# Patient Record
Sex: Female | Born: 1985 | Race: Black or African American | Hispanic: No | Marital: Single | Smoking: Never smoker
Health system: Southern US, Community
[De-identification: ages and names within clinical notes are randomized; demographics above are authoritative.]

---

## 2009-10-08 ENCOUNTER — Emergency Department (HOSPITAL_COMMUNITY): Admission: EM | Admit: 2009-10-08 | Discharge: 2009-10-08 | Payer: Self-pay | Admitting: Emergency Medicine

## 2009-10-30 ENCOUNTER — Emergency Department (HOSPITAL_COMMUNITY): Admission: EM | Admit: 2009-10-30 | Discharge: 2009-10-30 | Payer: Self-pay | Admitting: Emergency Medicine

## 2015-04-21 ENCOUNTER — Encounter (HOSPITAL_COMMUNITY): Payer: Self-pay | Admitting: Emergency Medicine

## 2015-04-21 ENCOUNTER — Emergency Department (HOSPITAL_COMMUNITY)
Admission: EM | Admit: 2015-04-21 | Discharge: 2015-04-21 | Disposition: A | Discharge status: Home / Self Care | Payer: Medicaid Other | Attending: Emergency Medicine | Admitting: Emergency Medicine

## 2015-04-21 ENCOUNTER — Emergency Department (HOSPITAL_COMMUNITY): Payer: Medicaid Other

## 2015-04-21 DIAGNOSIS — R1011 Right upper quadrant pain: Secondary | ICD-10-CM

## 2015-04-21 DIAGNOSIS — R110 Nausea: Secondary | ICD-10-CM

## 2015-04-21 DIAGNOSIS — K8020 Calculus of gallbladder without cholecystitis without obstruction: Secondary | ICD-10-CM | POA: Diagnosis not present

## 2015-04-21 LAB — COMPREHENSIVE METABOLIC PANEL
ALK PHOS: 226 U/L — AB (ref 38–126)
ALT: 317 U/L — AB (ref 14–54)
AST: 349 U/L — AB (ref 15–41)
Albumin: 3.9 g/dL (ref 3.5–5.0)
Anion gap: 9 (ref 5–15)
BUN: 8 mg/dL (ref 6–20)
CALCIUM: 9.6 mg/dL (ref 8.9–10.3)
CHLORIDE: 104 mmol/L (ref 101–111)
CO2: 27 mmol/L (ref 22–32)
CREATININE: 1.14 mg/dL — AB (ref 0.44–1.00)
GFR calc Af Amer: >60 mL/min (ref >60)
Glucose, Bld: 92 mg/dL (ref 65–99)
Potassium: 4.1 mmol/L (ref 3.5–5.1)
Sodium: 140 mmol/L (ref 135–145)
Total Bilirubin: 0.7 mg/dL (ref 0.3–1.2)
Total Protein: 7.6 g/dL (ref 6.5–8.1)

## 2015-04-21 LAB — CBC
HCT: 38.5 % (ref 36.0–46.0)
Hemoglobin: 11.9 g/dL — ABNORMAL LOW (ref 12.0–15.0)
MCH: 26.5 pg (ref 26.0–34.0)
MCHC: 30.9 g/dL (ref 30.0–36.0)
MCV: 85.7 fL (ref 78.0–100.0)
PLATELETS: 317 10*3/uL (ref 150–400)
RBC: 4.49 MIL/uL (ref 3.87–5.11)
RDW: 14.5 % (ref 11.5–15.5)
WBC: 5.8 10*3/uL (ref 4.0–10.5)

## 2015-04-21 LAB — URINALYSIS, ROUTINE W REFLEX MICROSCOPIC
Bilirubin Urine: NEGATIVE
GLUCOSE, UA: NEGATIVE mg/dL
KETONES UR: 15 mg/dL — AB
Leukocytes, UA: NEGATIVE
Nitrite: NEGATIVE
PROTEIN: NEGATIVE mg/dL
Specific Gravity, Urine: 1.023 (ref 1.005–1.030)
pH: 6 (ref 5.0–8.0)

## 2015-04-21 LAB — URINE MICROSCOPIC-ADD ON

## 2015-04-21 LAB — LIPASE, BLOOD: LIPASE: 38 U/L (ref 11–51)

## 2015-04-21 MED ORDER — FENTANYL CITRATE (PF) 100 MCG/2ML IJ SOLN
100.0000 ug | Freq: Once | INTRAMUSCULAR | Status: AC
  Administered 2015-04-21: 100 ug via INTRAVENOUS
  Filled 2015-04-21: qty 2

## 2015-04-21 MED ORDER — ONDANSETRON HCL 4 MG/2ML IJ SOLN
4.0000 mg | Freq: Once | INTRAMUSCULAR | Status: AC
  Administered 2015-04-21: 4 mg via INTRAVENOUS
  Filled 2015-04-21: qty 2

## 2015-04-21 MED ORDER — ONDANSETRON 4 MG PO TBDP: 4.0000 mg | ORAL_TABLET | Freq: Three times a day (TID) | ORAL | Status: AC | PRN

## 2015-04-21 MED ORDER — SODIUM CHLORIDE 0.9 % IV BOLUS (SEPSIS)
1000.0000 mL | Freq: Once | INTRAVENOUS | Status: AC
  Administered 2015-04-21: 1000 mL via INTRAVENOUS

## 2015-04-21 NOTE — Progress Notes (Signed)
Examined otherwise healthy patient who is still somewhat symptomatic from cholelithiasis.  Elevated LFTs probably from a passed CBD stone.  If she can become minimally symptomatic tonight, she can go home and I will try to get her on the schedule for DOW on Monday.  If not, will admit her tonight and plan for OR on Thursday/tomorrow.  Marta LamasJames O. Gae BonWyatt, III, MD, FACS [phone]--pager [phone]--office [facility]

## 2015-04-21 NOTE — Discharge Instructions (Signed)
Biliary Colic °Biliary colic is a pain in the upper abdomen. The pain: °· Is usually felt on the right side of the abdomen, but it may also be felt in the center of the abdomen, just below the breastbone (sternum). °· May spread back toward the right shoulder blade. °· May be steady or irregular. °· May be accompanied by nausea and vomiting. °Most of the time, the pain goes away in 1-5 hours. After the most intense pain passes, the abdomen may continue to ache mildly for about 24 hours. °Biliary colic is caused by a blockage in the bile duct. The bile duct is a pathway that carries bile--a liquid that helps to digest fats--from the gallbladder to the small intestine. Biliary colic usually occurs after eating, when the digestive system demands bile. The pain develops when muscle cells contract forcefully to try to move the blockage so that bile can get by. °HOME CARE INSTRUCTIONS °· Take medicines only as directed by your health care provider. °· Drink enough fluid to keep your urine clear or pale yellow. °· Avoid fatty, greasy, and fried foods. These kinds of foods increase your body's demand for bile. °· Avoid any foods that make your pain worse. °· Avoid overeating. °· Avoid having a large meal after fasting. °SEEK MEDICAL CARE IF: °· You develop a fever. °· Your pain gets worse. °· You vomit. °· You develop nausea that prevents you from eating and drinking. °SEEK IMMEDIATE MEDICAL CARE IF: °· You suddenly develop a fever and shaking chills. °· You develop a yellowish discoloration (jaundice) of: °¨ Skin. °¨ Whites of the eyes. °¨ Mucous membranes. °· You have continuous or severe pain that is not relieved with medicines. °· You have nausea and vomiting that is not relieved with medicines. °· You develop dizziness or you faint. °  °This information is not intended to replace advice given to you by your health care provider. Make sure you discuss any questions you have with your health care provider. °  °Document  Released: 10/16/2005 Document Revised: 09/29/2014 Document Reviewed: 02/24/2014 °Elsevier Interactive Patient Education ©2016 [employer] ° °

## 2015-04-21 NOTE — ED Notes (Signed)
Pt states since last Tuesday she having been having upper abdominal pain with nausea Denies any diarrhea or vomiting.  Pt states natural child birth 1 month ago.

## 2015-04-21 NOTE — ED Provider Notes (Signed)
CSN: [id number]     Arrival date & time 04/21/15  1803 History   First MD Initiated Contact with Patient 04/21/15 1831     Chief Complaint  Patient presents with  . Abdominal Pain    Patient is a 29 y.o. female presenting with abdominal pain. The history is provided by the patient.  Abdominal Pain Pain location:  Epigastric Pain quality: aching   Pain radiates to:  RUQ Pain severity:  Moderate Onset quality:  Gradual Duration:  1 week Timing:  Constant Chronicity:  New Associated symptoms: nausea   Associated symptoms: no chest pain, no constipation, no dysuria, no fever, no hematuria, no shortness of breath, no sore throat and no vomiting     History reviewed. No pertinent past medical history. History reviewed. No pertinent past surgical history. No family history on file. Social History  Substance Use Topics  . Smoking status: Never Smoker   . Smokeless tobacco: None  . Alcohol Use: No   OB History    No data available     Review of Systems  Constitutional: Negative for fever.  HENT: Negative for rhinorrhea and sore throat.   Eyes: Negative for visual disturbance.  Respiratory: Negative for chest tightness and shortness of breath.   Cardiovascular: Negative for chest pain and palpitations.  Gastrointestinal: Positive for nausea and abdominal pain. Negative for vomiting and constipation.  Genitourinary: Negative for dysuria and hematuria.  Musculoskeletal: Negative for back pain and neck pain.  Skin: Negative for rash.  Neurological: Negative for dizziness and headaches.  Psychiatric/Behavioral: Negative for confusion.  All other systems reviewed and are negative.     Allergies  Review of patient's allergies indicates no known allergies.  Home Medications   Prior to Admission medications   Medication Sig Start Date End Date Taking? Authorizing Provider  ondansetron (ZOFRAN ODT) 4 MG disintegrating tablet Take 1 tablet (4 mg total) by mouth every 8 (eight)  hours as needed for nausea or vomiting. 04/21/15   Gustavus Bryant, MD   BP 130/93 mmHg  Pulse 75  Temp(Src) 97.9 F (36.6 C) (Oral)  Resp 16  Ht _0  (1.854 m)  Wt 86.183 kg  BMI 25.07 kg/m2  SpO2 99% Physical Exam  Constitutional: She is oriented to person, place, and time. She appears well-developed and well-nourished. No distress.  HENT:  Head: Normocephalic and atraumatic.  Mouth/Throat: Oropharynx is clear and moist.  Eyes: EOM are normal. Pupils are equal, round, and reactive to light.  Neck: Neck supple. No JVD present.  Cardiovascular: Normal rate, regular rhythm, normal heart sounds and intact distal pulses.  Exam reveals no gallop.   No murmur heard. Pulmonary/Chest: Effort normal and breath sounds normal. She has no wheezes. She has no rales.  Abdominal: Soft. She exhibits no distension. There is tenderness in the right upper quadrant. There is positive Murphy's sign. There is no rigidity, no rebound and no guarding.  Musculoskeletal: Normal range of motion. She exhibits no tenderness.  Neurological: She is alert and oriented to person, place, and time. No cranial nerve deficit. She exhibits normal muscle tone.  Skin: Skin is warm and dry. No rash noted.  Psychiatric: Her behavior is normal.    ED Course  Procedures  None   Labs Review Labs Reviewed  COMPREHENSIVE METABOLIC PANEL - Abnormal; Notable for the following:    Creatinine, Ser 1.14 (*)    AST 349 (*)    ALT 317 (*)    Alkaline Phosphatase 226 (*)  All other components within normal limits  CBC - Abnormal; Notable for the following:    Hemoglobin 11.9 (*)    All other components within normal limits  URINALYSIS, ROUTINE W REFLEX MICROSCOPIC (NOT AT [facility]) - Abnormal; Notable for the following:    Color, Urine AMBER (*)    APPearance CLOUDY (*)    Hgb urine dipstick LARGE (*)    Ketones, ur 15 (*)    All other components within normal limits  URINE MICROSCOPIC-ADD ON - Abnormal; Notable for the  following:    Squamous Epithelial / LPF 0-5 (*)    Bacteria, UA FEW (*)    All other components within normal limits  LIPASE, BLOOD    Imaging Review US Abdomen Limited Ruq  04/21/2015  CLINICAL DATA:  Abdominal pain with nausea for 1 week EXAM: US ABDOMEN LIMITED - RIGHT UPPER QUADRANT COMPARISON:  None. FINDINGS: Gallbladder: Within the gallbladder, there are multiple echogenic foci which move and shadow consistent with gallstones. Largest individual gallstone measures 5 mm in length. No gallbladder wall thickening or pericholecystic fluid. No sonographic Murphy sign noted. Common bile duct: Diameter: Common bile duct is prominent proximally, measuring 8 mm in diameter. More distally, the common bile duct is obscured by gas. Liver: No focal lesion identified. Within normal limits in parenchymal echogenicity. IMPRESSION: Cholelithiasis. Prominence of the proximal common bile duct with distal common bile duct obscured by gas. A distal partially obstructing common bile duct lesion cannot be excluded by this study. If further evaluation is felt to be warranted, MRCP could be helpful to further assess. Electronically Signed   By: Lowella Grip III M.D.   On: 04/21/2015 19:35   I have personally reviewed and evaluated these images and lab results as part of my medical decision-making.  MDM   Final diagnoses:  Nausea  RUQ pain  Symptomatic cholelithiasis    1 week epigastric pain radiating to RUQ, ? Worse with eating. No bowel, vaginal or urinary symptoms. TTP RUQ with + Murphy sign. Also with diffuse abd TTP. Patient is 1 month postpartum. Will check labs and RUQ [place].   LFTs and alk phos elevated. Normal WBC. Suspicious for hepatobiliary disease. [place] with gallstone. CBD 8 mm. No cholecystitis. General surgery consulted.  Pain resolved with pain meds. Will plan for elective surgical management. zofran prescribed. Stable for d/c. Able to tolerate PO.  Discussed with Dr. Audie Pinto.  Gustavus Bryant, MD 04/21/15 2154  Leonard Schwartz, MD 05/03/15 [phone]

## 2015-04-22 ENCOUNTER — Encounter (HOSPITAL_COMMUNITY): Payer: Self-pay | Admitting: *Deleted

## 2015-04-22 ENCOUNTER — Ambulatory Visit: Payer: Self-pay | Admitting: General Surgery

## 2015-04-22 ENCOUNTER — Inpatient Hospital Stay (HOSPITAL_COMMUNITY)
Admission: EM | Admit: 2015-04-22 | Discharge: 2015-04-25 | DRG: 769 | Disposition: A | Discharge status: Home / Self Care | Payer: Medicaid Other | Attending: General Surgery | Admitting: General Surgery

## 2015-04-22 DIAGNOSIS — K851 Biliary acute pancreatitis without necrosis or infection: Secondary | ICD-10-CM

## 2015-04-22 DIAGNOSIS — Z91030 Bee allergy status: Secondary | ICD-10-CM

## 2015-04-22 DIAGNOSIS — Z419 Encounter for procedure for purposes other than remedying health state, unspecified: Secondary | ICD-10-CM

## 2015-04-22 DIAGNOSIS — O2663 Liver and biliary tract disorders in the puerperium: Principal | ICD-10-CM | POA: Diagnosis present

## 2015-04-22 LAB — CBC WITH DIFFERENTIAL/PLATELET
Basophils Absolute: 0 10*3/uL (ref 0.0–0.1)
Basophils Relative: 0 %
EOS PCT: 6 %
Eosinophils Absolute: 0.4 10*3/uL (ref 0.0–0.7)
HCT: 35.9 % — ABNORMAL LOW (ref 36.0–46.0)
Hemoglobin: 11.3 g/dL — ABNORMAL LOW (ref 12.0–15.0)
LYMPHS ABS: 2.5 10*3/uL (ref 0.7–4.0)
LYMPHS PCT: 31 %
MCH: 27.2 pg (ref 26.0–34.0)
MCHC: 31.5 g/dL (ref 30.0–36.0)
MCV: 86.3 fL (ref 78.0–100.0)
MONO ABS: 0.5 10*3/uL (ref 0.1–1.0)
MONOS PCT: 6 %
Neutro Abs: 4.6 10*3/uL (ref 1.7–7.7)
Neutrophils Relative %: 57 %
PLATELETS: 302 10*3/uL (ref 150–400)
RBC: 4.16 MIL/uL (ref 3.87–5.11)
RDW: 14.6 % (ref 11.5–15.5)
WBC: 8.1 10*3/uL (ref 4.0–10.5)

## 2015-04-22 MED ORDER — ONDANSETRON HCL 4 MG/2ML IJ SOLN
4.0000 mg | Freq: Once | INTRAMUSCULAR | Status: AC
  Administered 2015-04-22: 4 mg via INTRAVENOUS
  Filled 2015-04-22: qty 2

## 2015-04-22 MED ORDER — HYDROMORPHONE HCL 1 MG/ML IJ SOLN
1.0000 mg | Freq: Once | INTRAMUSCULAR | Status: AC
  Administered 2015-04-22: 1 mg via INTRAVENOUS
  Filled 2015-04-22: qty 1

## 2015-04-22 NOTE — ED Notes (Signed)
Pt states she was here last night and told that she has gallstones. Was informed to come back to ED if pain got worse. Pt states that the pain has gotten worse.

## 2015-04-22 NOTE — ED Provider Notes (Signed)
CSN: [id number]     Arrival date & time 04/22/15  2205 History  By signing my name below, I, Doreatha MartinEva Mathews, attest that this documentation has been prepared under the direction and in the presence of Derwood KaplanAnkit Katja Blue, MD. Electronically Signed: Doreatha MartinEva Mathews, ED Scribe. 04/22/2015. 11:35 PM.    Chief Complaint  Patient presents with  . Cholelithiasis   The history is provided by the patient. No language interpreter was used.    HPI Comments: Kylie Frank is a 29 y.o. female with h/o cholelithiasis who presents to the Emergency Department complaining of moderate, intermittent epigastric and RUQ abdominal pain onset at 2AM this morning and again this evening. She states associated nausea and emesis (x2 today). No treatments tried PTA. She states that pain was worsened after eating this evening. Pt was seen yesterday for the same pain that was onset a week ago. She was diagnosed with cholelithiasis and given return precautions if her pain worsened. She reports that pain medication given  in the ED gave temporarily relief of pain.    History reviewed. No pertinent past medical history. History reviewed. No pertinent past surgical history. History reviewed. No pertinent family history. Social History  Substance Use Topics  . Smoking status: Never Smoker   . Smokeless tobacco: None  . Alcohol Use: No   OB History    No data available     Review of Systems  Gastrointestinal: Positive for nausea, vomiting and abdominal pain.   A complete 10 system review of systems was obtained and all systems are negative except as noted in the HPI and PMH.    Allergies  Bee venom  Home Medications   Prior to Admission medications   Medication Sig Start Date End Date Taking? Authorizing Provider  ondansetron (ZOFRAN ODT) 4 MG disintegrating tablet Take 1 tablet (4 mg total) by mouth every 8 (eight) hours as needed for nausea or vomiting. 04/21/15  Yes Maris BergerJonah Gunalda, MD  oxyCODONE-acetaminophen (ROXICET)  5-325 MG tablet Take 1-2 tablets by mouth every 4 (four) hours as needed. 04/25/15   Chevis PrettyPaul Toth III, MD  traMADol (ULTRAM) 50 MG tablet Take 1-2 tablets (50-100 mg total) by mouth every 6 (six) hours as needed. 04/25/15   Chevis PrettyPaul Toth III, MD   BP 133/88 mmHg  Pulse 88  Temp(Src) 98.3 F (36.8 C) (Oral)  Resp 16  Ht 6\' 1"  (1.854 m)  Wt 234 lb 9.6 oz (106.414 kg)  BMI 30.96 kg/m2  SpO2 99% Physical Exam  Constitutional: She is oriented to person, place, and time. She appears well-developed and well-nourished.  HENT:  Head: Normocephalic and atraumatic.  Eyes: Conjunctivae and EOM are normal. Pupils are equal, round, and reactive to light.  Neck: Normal range of motion. Neck supple.  Cardiovascular: Normal rate, regular rhythm and normal heart sounds.   Pulmonary/Chest: Effort normal and breath sounds normal. No respiratory distress.  Lungs CTA bilaterally.   Abdominal: Soft. She exhibits no distension. There is tenderness. There is guarding.  Positive RUQ tenderness with guarding.   Musculoskeletal: Normal range of motion.  Neurological: She is alert and oriented to person, place, and time.  Skin: Skin is warm and dry.  Psychiatric: She has a normal mood and affect. Her behavior is normal.  Nursing note and vitals reviewed.  ED Course  Procedures (including critical care time) DIAGNOSTIC STUDIES: Oxygen Saturation is 98% on RA, normal by my interpretation.    COORDINATION OF CARE: 11:26 PM Discussed treatment plan with pt at bedside and  pt agreed to plan.  12:58 AM On reassessment; labs indicate gallstone pancreatitis. General surgery has been called for admisson. Dr. Sheliah Hatch will admit pt.    Labs Review Labs Reviewed  CBC WITH DIFFERENTIAL/PLATELET - Abnormal; Notable for the following:    Hemoglobin 11.3 (*)    HCT 35.9 (*)    All other components within normal limits  COMPREHENSIVE METABOLIC PANEL - Abnormal; Notable for the following:    BUN 5 (*)    Creatinine, Ser  1.07 (*)    AST 284 (*)    ALT 351 (*)    Alkaline Phosphatase 225 (*)    All other components within normal limits  LIPASE, BLOOD - Abnormal; Notable for the following:    Lipase >3000 (*)    All other components within normal limits  CREATININE, SERUM - Abnormal; Notable for the following:    Creatinine, Ser 1.22 (*)    GFR calc non Af Amer 59 (*)    All other components within normal limits  LIPASE, BLOOD - Abnormal; Notable for the following:    Lipase 146 (*)    All other components within normal limits  COMPREHENSIVE METABOLIC PANEL - Abnormal; Notable for the following:    Potassium 3.3 (*)    BUN <5 (*)    AST 91 (*)    ALT 205 (*)    Alkaline Phosphatase 182 (*)    All other components within normal limits  CBC - Abnormal; Notable for the following:    Hemoglobin 10.9 (*)    HCT 34.8 (*)    All other components within normal limits  COMPREHENSIVE METABOLIC PANEL - Abnormal; Notable for the following:    Glucose, Bld 125 (*)    BUN 5 (*)    Albumin 3.4 (*)    AST 69 (*)    ALT 155 (*)    Alkaline Phosphatase 173 (*)    Total Bilirubin 0.1 (*)    All other components within normal limits  SURGICAL PCR SCREEN  DIFFERENTIAL  SURGICAL PATHOLOGY    Imaging Review Dg Cholangiogram Operative  04/24/2015  CLINICAL DATA:  Intraoperative cholangiogram EXAM: INTRAOPERATIVE CHOLANGIOGRAM TECHNIQUE: Cholangiographic images from the C-arm fluoroscopic device were submitted for interpretation post-operatively. Please see the procedural report for the amount of contrast and the fluoroscopy time utilized. COMPARISON:  None. FINDINGS: Forty-five intraoperative cholangiogram images submitted. There is contrast opacification of CBD, cystic duct intrahepatic ducts and contrast is noted progressing in duodenum. Mild prominent CBD. No CBD filling defects. No contrast extravasation. The patient is status postcholecystectomy. IMPRESSION: There is contrast opacification of CBD, cystic duct  intrahepatic ducts and contrast is noted progressing in duodenum. Mild prominent CBD. No CBD filling defects. No contrast extravasation. Fluoroscopy time 8.8 seconds.  Please see the operative report. Electronically Signed   By: Natasha Mead M.D.   On: 04/24/2015 11:39   I have personally reviewed and evaluated these images and lab results as part of my medical decision-making.  MDM   Final diagnoses:  Acute gallstone pancreatitis    I personally performed the services described in this documentation, which was scribed in my presence. The recorded information has been reviewed and is accurate.  Pt comes in with cc of RUQ abd pain. Pt was seen yday in the ER with the same complains and had an [place] that showed gall stones with possible CBD dilatation. She was discharged with relative pain control, however her pain returned and was severe so she came back to the  ER. Currently has RUQ guarding, labs are pending. Will need admission.   Derwood Kaplan, MD 04/25/15 [phone]

## 2015-04-23 DIAGNOSIS — K851 Biliary acute pancreatitis without necrosis or infection: Secondary | ICD-10-CM | POA: Diagnosis present

## 2015-04-23 DIAGNOSIS — O2663 Liver and biliary tract disorders in the puerperium: Secondary | ICD-10-CM | POA: Diagnosis present

## 2015-04-23 DIAGNOSIS — Z91030 Bee allergy status: Secondary | ICD-10-CM | POA: Diagnosis not present

## 2015-04-23 LAB — COMPREHENSIVE METABOLIC PANEL
ALT: 351 U/L — AB (ref 14–54)
AST: 284 U/L — ABNORMAL HIGH (ref 15–41)
Albumin: 3.9 g/dL (ref 3.5–5.0)
Alkaline Phosphatase: 225 U/L — ABNORMAL HIGH (ref 38–126)
Anion gap: 8 (ref 5–15)
BUN: 5 mg/dL — AB (ref 6–20)
CO2: 26 mmol/L (ref 22–32)
Calcium: 9.2 mg/dL (ref 8.9–10.3)
Chloride: 104 mmol/L (ref 101–111)
Creatinine, Ser: 1.07 mg/dL — ABNORMAL HIGH (ref 0.44–1.00)
GLUCOSE: 93 mg/dL (ref 65–99)
Potassium: 3.5 mmol/L (ref 3.5–5.1)
Sodium: 138 mmol/L (ref 135–145)
TOTAL PROTEIN: 7.6 g/dL (ref 6.5–8.1)
Total Bilirubin: 1.1 mg/dL (ref 0.3–1.2)

## 2015-04-23 LAB — CREATININE, SERUM
Creatinine, Ser: 1.22 mg/dL — ABNORMAL HIGH (ref 0.44–1.00)
GFR calc Af Amer: >60 mL/min (ref >60)
GFR, EST NON AFRICAN AMERICAN: 59 mL/min — AB (ref >60)

## 2015-04-23 LAB — LIPASE, BLOOD: Lipase: >3000 U/L — ABNORMAL HIGH (ref 11–51)

## 2015-04-23 LAB — SURGICAL PCR SCREEN
MRSA, PCR: NEGATIVE
Staphylococcus aureus: NEGATIVE

## 2015-04-23 MED ORDER — ACETAMINOPHEN 325 MG PO TABS: 650.0000 mg | ORAL_TABLET | Freq: Four times a day (QID) | ORAL | Status: DC | PRN

## 2015-04-23 MED ORDER — CEFOXITIN SODIUM 2 G IV SOLR
2.0000 g | Freq: Three times a day (TID) | INTRAVENOUS | Status: DC
  Administered 2015-04-23 – 2015-04-25 (×8): 2 g via INTRAVENOUS
  Filled 2015-04-23 (×10): qty 2

## 2015-04-23 MED ORDER — DIPHENHYDRAMINE HCL 25 MG PO CAPS: 25.0000 mg | ORAL_CAPSULE | Freq: Four times a day (QID) | ORAL | Status: DC | PRN

## 2015-04-23 MED ORDER — ENOXAPARIN SODIUM 40 MG/0.4ML ~~LOC~~ SOLN
40.0000 mg | SUBCUTANEOUS | Status: DC
  Filled 2015-04-23 (×2): qty 0.4

## 2015-04-23 MED ORDER — DIPHENHYDRAMINE HCL 50 MG/ML IJ SOLN: 25.0000 mg | Freq: Four times a day (QID) | INTRAMUSCULAR | Status: DC | PRN

## 2015-04-23 MED ORDER — ACETAMINOPHEN 650 MG RE SUPP: 650.0000 mg | Freq: Four times a day (QID) | RECTAL | Status: DC | PRN

## 2015-04-23 MED ORDER — KCL IN DEXTROSE-NACL 20-5-0.45 MEQ/L-%-% IV SOLN
INTRAVENOUS | Status: DC
  Administered 2015-04-23 – 2015-04-24 (×4): via INTRAVENOUS
  Filled 2015-04-23 (×4): qty 1000

## 2015-04-23 MED ORDER — ONDANSETRON 4 MG PO TBDP: 4.0000 mg | ORAL_TABLET | Freq: Four times a day (QID) | ORAL | Status: DC | PRN

## 2015-04-23 MED ORDER — ONDANSETRON HCL 4 MG/2ML IJ SOLN: 4.0000 mg | Freq: Four times a day (QID) | INTRAMUSCULAR | Status: DC | PRN

## 2015-04-23 MED ORDER — DEXTROSE 5 % IV SOLN
2.0000 g | Freq: Four times a day (QID) | INTRAVENOUS | Status: DC
  Administered 2015-04-23: 2 g via INTRAVENOUS
  Filled 2015-04-23 (×2): qty 2

## 2015-04-23 MED ORDER — MORPHINE SULFATE (PF) 2 MG/ML IV SOLN
2.0000 mg | INTRAVENOUS | Status: DC | PRN
  Administered 2015-04-23 – 2015-04-24 (×3): 2 mg via INTRAVENOUS
  Administered 2015-04-24 – 2015-04-25 (×4): 4 mg via INTRAVENOUS
  Filled 2015-04-23 (×2): qty 1
  Filled 2015-04-23: qty 2
  Filled 2015-04-23: qty 1
  Filled 2015-04-23 (×3): qty 2

## 2015-04-23 NOTE — H&P (Signed)
Kylie Frank is an 29 y.o. female.   Chief Complaint: RUQ pain HPI: 29 yo female with 1 week of RUQ pain. She has had no previous attacks with similar pains. She has had nausea and vomiting for the last 2 days. Pain is cramping and sharp in nature, has been constant for last 2 days. She initially went to the ER and was sent home with presumed symp gallstones, but returned today for worsening pain. No fevers or chills, Last BM today. She recently had a baby 1 month ago with vaginal delivery.  History reviewed. No pertinent past medical history.  History reviewed. No pertinent past surgical history.  No family history on file. Social History:  reports that she has never smoked. She does not have any smokeless tobacco history on file. She reports that she does not drink alcohol or use illicit drugs.  Allergies:  Allergies  Allergen Reactions  . Bee Venom Swelling and Other (See Comments)    "I turn purple"     (Not in a hospital admission)  Results for orders placed or performed during the hospital encounter of 04/22/15 (from the past 48 hour(s))  CBC with Differential     Status: Abnormal   Collection Time: 04/22/15 11:15 PM  Result Value Ref Range   WBC 8.1 4.0 - 10.5 K/uL   RBC 4.16 3.87 - 5.11 MIL/uL   Hemoglobin 11.3 (L) 12.0 - 15.0 g/dL   HCT 35.9 (L) 36.0 - 46.0 %   MCV 86.3 78.0 - 100.0 fL   MCH 27.2 26.0 - 34.0 pg   MCHC 31.5 30.0 - 36.0 g/dL   RDW 14.6 11.5 - 15.5 %   Platelets 302 150 - 400 K/uL   Neutrophils Relative % 57 %   Neutro Abs 4.6 1.7 - 7.7 K/uL   Lymphocytes Relative 31 %   Lymphs Abs 2.5 0.7 - 4.0 K/uL   Monocytes Relative 6 %   Monocytes Absolute 0.5 0.1 - 1.0 K/uL   Eosinophils Relative 6 %   Eosinophils Absolute 0.4 0.0 - 0.7 K/uL   Basophils Relative 0 %   Basophils Absolute 0.0 0.0 - 0.1 K/uL  Comprehensive metabolic panel     Status: Abnormal   Collection Time: 04/22/15 11:15 PM  Result Value Ref Range   Sodium 138 135 - 145 mmol/L    Potassium 3.5 3.5 - 5.1 mmol/L   Chloride 104 101 - 111 mmol/L   CO2 26 22 - 32 mmol/L   Glucose, Bld 93 65 - 99 mg/dL   BUN 5 (L) 6 - 20 mg/dL   Creatinine, Ser 1.07 (H) 0.44 - 1.00 mg/dL   Calcium 9.2 8.9 - 10.3 mg/dL   Total Protein 7.6 6.5 - 8.1 g/dL   Albumin 3.9 3.5 - 5.0 g/dL   AST 284 (H) 15 - 41 U/L   ALT 351 (H) 14 - 54 U/L   Alkaline Phosphatase 225 (H) 38 - 126 U/L   Total Bilirubin 1.1 0.3 - 1.2 mg/dL   GFR calc non Af Amer >60 >60 mL/min   GFR calc Af Amer >60 >60 mL/min    Comment: (NOTE) The eGFR has been calculated using the CKD EPI equation. This calculation has not been validated in all clinical situations. eGFR's persistently <60 mL/min signify possible Chronic Kidney Disease.    Anion gap 8 5 - 15  Lipase, blood     Status: Abnormal   Collection Time: 04/22/15 11:15 PM  Result Value Ref Range   Lipase >  3000 (H) 11 - 51 U/L    Comment: RESULTS CONFIRMED BY MANUAL DILUTION   US Abdomen Limited Ruq  04/21/2015  CLINICAL DATA:  Abdominal pain with nausea for 1 week EXAM: US ABDOMEN LIMITED - RIGHT UPPER QUADRANT COMPARISON:  None. FINDINGS: Gallbladder: Within the gallbladder, there are multiple echogenic foci which move and shadow consistent with gallstones. Largest individual gallstone measures 5 mm in length. No gallbladder wall thickening or pericholecystic fluid. No sonographic Murphy sign noted. Common bile duct: Diameter: Common bile duct is prominent proximally, measuring 8 mm in diameter. More distally, the common bile duct is obscured by gas. Liver: No focal lesion identified. Within normal limits in parenchymal echogenicity. IMPRESSION: Cholelithiasis. Prominence of the proximal common bile duct with distal common bile duct obscured by gas. A distal partially obstructing common bile duct lesion cannot be excluded by this study. If further evaluation is felt to be warranted, MRCP could be helpful to further assess. Electronically Signed   By: Lowella Grip  III M.D.   On: 04/21/2015 19:35    Review of Systems  Constitutional: Negative for fever and chills.  HENT: Negative for hearing loss.   Eyes: Negative for blurred vision and double vision.  Respiratory: Negative for cough and hemoptysis.   Cardiovascular: Negative for chest pain and palpitations.  Gastrointestinal: Positive for nausea, vomiting and abdominal pain. Negative for diarrhea and constipation.  Genitourinary: Negative for dysuria and urgency.  Musculoskeletal: Negative for myalgias and neck pain.  Skin: Negative for itching and rash.  Neurological: Negative for dizziness, tingling and headaches.  Endo/Heme/Allergies: Does not bruise/bleed easily.  Psychiatric/Behavioral: Negative for depression and suicidal ideas.    Blood pressure 134/85, pulse 60, temperature 97.6 F (36.4 C), temperature source Oral, resp. rate 18, height _0  (1.854 m), weight 105.779 kg (233 lb 3.2 oz), SpO2 96 %. Physical Exam  Vitals reviewed. Constitutional: She is oriented to person, place, and time. She appears well-developed and well-nourished.  HENT:  Head: Normocephalic and atraumatic.  Eyes: Conjunctivae and EOM are normal. Pupils are equal, round, and reactive to light.  Neck: Normal range of motion. Neck supple.  Cardiovascular: Normal rate and regular rhythm.   Respiratory: Effort normal and breath sounds normal.  GI: Soft. Bowel sounds are normal. She exhibits no distension.  RUQ tenderness  Musculoskeletal: Normal range of motion.  Neurological: She is alert and oriented to person, place, and time.  Skin: Skin is warm and dry.  Psychiatric: She has a normal mood and affect. Her behavior is normal.     Assessment/Plan 29 yo female with gallstone pancreatitis. -NPO -admit -pain control -recheck labs in 24h -will need chole with IOC this hospitalization  Arta Bruce Kinsinger 04/23/2015, 1:34 AM

## 2015-04-23 NOTE — Progress Notes (Signed)
  Subjective: No complaints. Feels better  Objective: Vital signs in last 24 hours: Temp:  [97.5 F (36.4 C)-98.5 F (36.9 C)] 98.5 F (36.9 C) (11/25 0530) Pulse Rate:  [50-78] 63 (11/25 0700) Resp:  [18] 18 (11/25 0530) BP: (116-150)/(61-97) 116/61 mmHg (11/25 0530) SpO2:  [95 %-100 %] 96 % (11/25 0530) Weight:  [105.779 kg (233 lb 3.2 oz)-107.548 kg (237 lb 1.6 oz)] 107.548 kg (237 lb 1.6 oz) (11/25 0247) Last BM Date: 04/22/15  Intake/Output from previous day: 11/24 0701 - 11/25 0700 In: 667.9 [P.O.:120; I.V.:497.9; IV Piggyback:50] Out: -  Intake/Output this shift: Total I/O In: 202.1 [I.V.:152.1; IV Piggyback:50] Out: 550 [Urine:550]  Resp: clear to auscultation bilaterally Cardio: regular rate and rhythm GI: soft, nontender  Lab Results:   Recent Labs  04/21/15 1814 04/22/15 2315  WBC 5.8 8.1  HGB 11.9* 11.3*  HCT 38.5 35.9*  PLT 317 302   BMET  Recent Labs  04/21/15 1814 04/22/15 2315 04/23/15 0203  NA 140 138  --   K 4.1 3.5  --   CL 104 104  --   CO2 27 26  --   GLUCOSE 92 93  --   BUN 8 5*  --   CREATININE 1.14* 1.07* 1.22*  CALCIUM 9.6 9.2  --    PT/INR No results for input(s): LABPROT, INR in the last 72 hours. ABG No results for input(s): PHART, HCO3 in the last 72 hours.  Invalid input(s): PCO2, PO2  Studies/Results: [place] Abdomen Limited Ruq  04/21/2015  CLINICAL DATA:  Abdominal pain with nausea for 1 week EXAM: US ABDOMEN LIMITED - RIGHT UPPER QUADRANT COMPARISON:  None. FINDINGS: Gallbladder: Within the gallbladder, there are multiple echogenic foci which move and shadow consistent with gallstones. Largest individual gallstone measures 5 mm in length. No gallbladder wall thickening or pericholecystic fluid. No sonographic Murphy sign noted. Common bile duct: Diameter: Common bile duct is prominent proximally, measuring 8 mm in diameter. More distally, the common bile duct is obscured by gas. Liver: No focal lesion identified. Within  normal limits in parenchymal echogenicity. IMPRESSION: Cholelithiasis. Prominence of the proximal common bile duct with distal common bile duct obscured by gas. A distal partially obstructing common bile duct lesion cannot be excluded by this study. If further evaluation is felt to be warranted, MRCP could be helpful to further assess. Electronically Signed   By: Bretta BangWilliam  Woodruff III M.D.   On: 04/21/2015 19:35    Anti-infectives: Anti-infectives    Start     Dose/Rate Route Frequency Ordered Stop   04/23/15 0600  cefOXitin (MEFOXIN) 2 g in dextrose 5 % 50 mL IVPB     2 g 100 mL/hr over 30 Minutes Intravenous 3 times per day 04/23/15 0140     04/23/15 0100  cefOXitin (MEFOXIN) 2 g in dextrose 5 % 50 mL IVPB  Status:  Discontinued     2 g 100 mL/hr over 30 Minutes Intravenous 4 times per day 04/23/15 0059 04/23/15 0140      Assessment/Plan: s/p * No surgery found * continue bowel rest  Recheck lft's and amylase tomorrow  LOS: 0 days    TOTH III,Shadae Reino S 04/23/2015

## 2015-04-23 NOTE — Care Management Note (Signed)
Case Management Note  Patient Details  Name: Kylie Frank MRN: [id number] Date of Birth: 03-Feb-1986  Subjective/Objective:                    Action/Plan:   Expected Discharge Date:                  Expected Discharge Plan:  Home/Self Care  In-House Referral:     Discharge planning Services     Post Acute Care Choice:    Choice offered to:     DME Arranged:    DME Agency:     HH Arranged:    HH Agency:     Status of Service:  In process, will continue to follow  Medicare Important Message Given:    Date Medicare IM Given:    Medicare IM give by:    Date Additional Medicare IM Given:    Additional Medicare Important Message give by:     If discussed at Long Length of Stay Meetings, dates discussed:    Additional Comments: Initial UR completed  Kingsley PlanWile, Marshella Tello Marie, RN 04/23/2015, 8:30 AM

## 2015-04-24 ENCOUNTER — Inpatient Hospital Stay (HOSPITAL_COMMUNITY): Payer: Medicaid Other | Admitting: Anesthesiology

## 2015-04-24 ENCOUNTER — Encounter (HOSPITAL_COMMUNITY): Admission: EM | Disposition: A | Discharge status: Home / Self Care | Payer: Self-pay | Source: Home / Self Care

## 2015-04-24 ENCOUNTER — Encounter (HOSPITAL_COMMUNITY): Payer: Self-pay | Admitting: Anesthesiology

## 2015-04-24 ENCOUNTER — Inpatient Hospital Stay (HOSPITAL_COMMUNITY): Payer: Medicaid Other

## 2015-04-24 HISTORY — PX: CHOLECYSTECTOMY: SHX55

## 2015-04-24 LAB — DIFFERENTIAL
Basophils Absolute: 0 10*3/uL (ref 0.0–0.1)
Basophils Relative: 0 %
Eosinophils Absolute: 0.4 10*3/uL (ref 0.0–0.7)
Eosinophils Relative: 9 %
LYMPHS PCT: 45 %
Lymphs Abs: 2.2 10*3/uL (ref 0.7–4.0)
MONO ABS: 0.4 10*3/uL (ref 0.1–1.0)
MONOS PCT: 7 %
NEUTROS ABS: 1.9 10*3/uL (ref 1.7–7.7)
Neutrophils Relative %: 39 %

## 2015-04-24 LAB — COMPREHENSIVE METABOLIC PANEL
ALBUMIN: 3.5 g/dL (ref 3.5–5.0)
ALT: 205 U/L — ABNORMAL HIGH (ref 14–54)
ANION GAP: 6 (ref 5–15)
AST: 91 U/L — AB (ref 15–41)
Alkaline Phosphatase: 182 U/L — ABNORMAL HIGH (ref 38–126)
BILIRUBIN TOTAL: 0.7 mg/dL (ref 0.3–1.2)
CHLORIDE: 104 mmol/L (ref 101–111)
CO2: 27 mmol/L (ref 22–32)
Calcium: 9.1 mg/dL (ref 8.9–10.3)
Creatinine, Ser: 0.92 mg/dL (ref 0.44–1.00)
GFR calc Af Amer: >60 mL/min (ref >60)
Glucose, Bld: 93 mg/dL (ref 65–99)
POTASSIUM: 3.3 mmol/L — AB (ref 3.5–5.1)
Sodium: 137 mmol/L (ref 135–145)
TOTAL PROTEIN: 7 g/dL (ref 6.5–8.1)

## 2015-04-24 LAB — CBC
HEMATOCRIT: 34.8 % — AB (ref 36.0–46.0)
HEMOGLOBIN: 10.9 g/dL — AB (ref 12.0–15.0)
MCH: 27 pg (ref 26.0–34.0)
MCHC: 31.3 g/dL (ref 30.0–36.0)
MCV: 86.4 fL (ref 78.0–100.0)
Platelets: 274 10*3/uL (ref 150–400)
RBC: 4.03 MIL/uL (ref 3.87–5.11)
RDW: 14.5 % (ref 11.5–15.5)
WBC: 4.9 10*3/uL (ref 4.0–10.5)

## 2015-04-24 LAB — LIPASE, BLOOD: LIPASE: 146 U/L — AB (ref 11–51)

## 2015-04-24 SURGERY — LAPAROSCOPIC CHOLECYSTECTOMY WITH INTRAOPERATIVE CHOLANGIOGRAM
Anesthesia: General

## 2015-04-24 MED ORDER — KCL IN DEXTROSE-NACL 20-5-0.45 MEQ/L-%-% IV SOLN
INTRAVENOUS | Status: DC
  Administered 2015-04-24 – 2015-04-25 (×2): via INTRAVENOUS
  Filled 2015-04-24 (×2): qty 1000

## 2015-04-24 MED ORDER — ONDANSETRON HCL 4 MG/2ML IJ SOLN
INTRAMUSCULAR | Status: AC
  Filled 2015-04-24: qty 2

## 2015-04-24 MED ORDER — NEOSTIGMINE METHYLSULFATE 10 MG/10ML IV SOLN
INTRAVENOUS | Status: AC
  Filled 2015-04-24: qty 1

## 2015-04-24 MED ORDER — MIDAZOLAM HCL 5 MG/5ML IJ SOLN
INTRAMUSCULAR | Status: DC | PRN
  Administered 2015-04-24: 2 mg via INTRAVENOUS

## 2015-04-24 MED ORDER — HYDROMORPHONE HCL 1 MG/ML IJ SOLN
0.2500 mg | INTRAMUSCULAR | Status: DC | PRN
  Administered 2015-04-24 (×2): 0.5 mg via INTRAVENOUS

## 2015-04-24 MED ORDER — ARTIFICIAL TEARS OP OINT
TOPICAL_OINTMENT | OPHTHALMIC | Status: AC
  Filled 2015-04-24: qty 3.5

## 2015-04-24 MED ORDER — GLYCOPYRROLATE 0.2 MG/ML IJ SOLN
INTRAMUSCULAR | Status: AC
  Filled 2015-04-24: qty 4

## 2015-04-24 MED ORDER — LACTATED RINGERS IV SOLN
INTRAVENOUS | Status: DC | PRN
Start: 2015-04-24 — End: 2015-04-24
  Administered 2015-04-24 (×2): via INTRAVENOUS

## 2015-04-24 MED ORDER — EPHEDRINE SULFATE 50 MG/ML IJ SOLN
INTRAMUSCULAR | Status: AC
  Filled 2015-04-24: qty 1

## 2015-04-24 MED ORDER — MIDAZOLAM HCL 2 MG/2ML IJ SOLN
INTRAMUSCULAR | Status: AC
  Filled 2015-04-24: qty 2

## 2015-04-24 MED ORDER — OXYCODONE HCL 5 MG PO TABS
10.0000 mg | ORAL_TABLET | ORAL | Status: DC | PRN
  Administered 2015-04-24 – 2015-04-25 (×2): 10 mg via ORAL
  Filled 2015-04-24 (×2): qty 2

## 2015-04-24 MED ORDER — DEXAMETHASONE SODIUM PHOSPHATE 4 MG/ML IJ SOLN
INTRAMUSCULAR | Status: DC | PRN
  Administered 2015-04-24: 8 mg via INTRAVENOUS

## 2015-04-24 MED ORDER — PROMETHAZINE HCL 25 MG/ML IJ SOLN: 6.2500 mg | INTRAMUSCULAR | Status: DC | PRN

## 2015-04-24 MED ORDER — SODIUM CHLORIDE 0.9 % IR SOLN
Status: DC | PRN
  Administered 2015-04-24: 1

## 2015-04-24 MED ORDER — ROCURONIUM BROMIDE 50 MG/5ML IV SOLN
INTRAVENOUS | Status: AC
  Filled 2015-04-24: qty 1

## 2015-04-24 MED ORDER — LIDOCAINE HCL (CARDIAC) 20 MG/ML IV SOLN
INTRAVENOUS | Status: AC
  Filled 2015-04-24: qty 10

## 2015-04-24 MED ORDER — SUCCINYLCHOLINE CHLORIDE 20 MG/ML IJ SOLN
INTRAMUSCULAR | Status: DC | PRN
  Administered 2015-04-24: 120 mg via INTRAVENOUS

## 2015-04-24 MED ORDER — 0.9 % SODIUM CHLORIDE (POUR BTL) OPTIME
TOPICAL | Status: DC | PRN
Start: 2015-04-24 — End: 2015-04-24
  Administered 2015-04-24: 1000 mL

## 2015-04-24 MED ORDER — LIDOCAINE HCL (CARDIAC) 20 MG/ML IV SOLN
INTRAVENOUS | Status: DC | PRN
  Administered 2015-04-24: 20 mg via INTRAVENOUS

## 2015-04-24 MED ORDER — DEXAMETHASONE SODIUM PHOSPHATE 4 MG/ML IJ SOLN
INTRAMUSCULAR | Status: AC
  Filled 2015-04-24: qty 2

## 2015-04-24 MED ORDER — SODIUM CHLORIDE 0.9 % IV SOLN
INTRAVENOUS | Status: DC | PRN
  Administered 2015-04-24: 15 mL

## 2015-04-24 MED ORDER — IBUPROFEN 600 MG PO TABS: 600.0000 mg | ORAL_TABLET | Freq: Four times a day (QID) | ORAL | Status: DC | PRN

## 2015-04-24 MED ORDER — MEPERIDINE HCL 25 MG/ML IJ SOLN: 6.2500 mg | INTRAMUSCULAR | Status: DC | PRN

## 2015-04-24 MED ORDER — HYDROMORPHONE HCL 1 MG/ML IJ SOLN
INTRAMUSCULAR | Status: AC
  Filled 2015-04-24: qty 1

## 2015-04-24 MED ORDER — DEXTROSE 5 % IV SOLN: 2.0000 g | INTRAVENOUS | Status: DC

## 2015-04-24 MED ORDER — MIDAZOLAM HCL 2 MG/2ML IJ SOLN: 0.5000 mg | Freq: Once | INTRAMUSCULAR | Status: DC | PRN

## 2015-04-24 MED ORDER — BUPIVACAINE-EPINEPHRINE (PF) 0.25% -1:200000 IJ SOLN
INTRAMUSCULAR | Status: AC
  Filled 2015-04-24: qty 30

## 2015-04-24 MED ORDER — PROPOFOL 10 MG/ML IV BOLUS
INTRAVENOUS | Status: DC | PRN
  Administered 2015-04-24: 150 mg via INTRAVENOUS
  Administered 2015-04-24: 50 mg via INTRAVENOUS

## 2015-04-24 MED ORDER — BUPIVACAINE-EPINEPHRINE 0.25% -1:200000 IJ SOLN
INTRAMUSCULAR | Status: DC | PRN
  Administered 2015-04-24: 30 mL

## 2015-04-24 MED ORDER — ONDANSETRON HCL 4 MG/2ML IJ SOLN
INTRAMUSCULAR | Status: DC | PRN
  Administered 2015-04-24: 4 mg via INTRAVENOUS

## 2015-04-24 MED ORDER — FENTANYL CITRATE (PF) 250 MCG/5ML IJ SOLN
INTRAMUSCULAR | Status: AC
  Filled 2015-04-24: qty 5

## 2015-04-24 MED ORDER — GLYCOPYRROLATE 0.2 MG/ML IJ SOLN
INTRAMUSCULAR | Status: DC | PRN
  Administered 2015-04-24: .8 mg via INTRAVENOUS

## 2015-04-24 MED ORDER — LIDOCAINE HCL 4 % MT SOLN
OROMUCOSAL | Status: DC | PRN
  Administered 2015-04-24: 4 mL via TOPICAL

## 2015-04-24 MED ORDER — NEOSTIGMINE METHYLSULFATE 10 MG/10ML IV SOLN
INTRAVENOUS | Status: DC | PRN
  Administered 2015-04-24: 4 mg via INTRAVENOUS

## 2015-04-24 MED ORDER — ROCURONIUM BROMIDE 100 MG/10ML IV SOLN
INTRAVENOUS | Status: DC | PRN
  Administered 2015-04-24: 5 mg via INTRAVENOUS
  Administered 2015-04-24: 30 mg via INTRAVENOUS

## 2015-04-24 MED ORDER — SODIUM CHLORIDE 0.9 % IJ SOLN
INTRAMUSCULAR | Status: AC
  Filled 2015-04-24: qty 10

## 2015-04-24 MED ORDER — FENTANYL CITRATE (PF) 100 MCG/2ML IJ SOLN
INTRAMUSCULAR | Status: DC | PRN
  Administered 2015-04-24 (×5): 50 ug via INTRAVENOUS
  Administered 2015-04-24: 25 ug via INTRAVENOUS

## 2015-04-24 SURGICAL SUPPLY — 40 items
APPLIER CLIP ROT 10 11.4 M/L (STAPLE) ×3
BANDAGE ADH SHEER 1  50/CT (GAUZE/BANDAGES/DRESSINGS) ×12 IMPLANT
BENZOIN TINCTURE PRP APPL 2/3 (GAUZE/BANDAGES/DRESSINGS) ×3 IMPLANT
BLADE SURG ROTATE 9660 (MISCELLANEOUS) IMPLANT
CANISTER SUCTION 2500CC (MISCELLANEOUS) ×3 IMPLANT
CHLORAPREP W/TINT 26ML (MISCELLANEOUS) ×3 IMPLANT
CLIP APPLIE ROT 10 11.4 M/L (STAPLE) ×1 IMPLANT
CLOSURE STERI-STRIP 1/2X4 (GAUZE/BANDAGES/DRESSINGS) ×1
CLOSURE WOUND 1/2 X4 (GAUZE/BANDAGES/DRESSINGS) ×1
CLSR STERI-STRIP ANTIMIC 1/2X4 (GAUZE/BANDAGES/DRESSINGS) ×2 IMPLANT
COVER MAYO STAND STRL (DRAPES) ×3 IMPLANT
COVER SURGICAL LIGHT HANDLE (MISCELLANEOUS) ×3 IMPLANT
DEVICE TROCAR PUNCTURE CLOSURE (ENDOMECHANICALS) IMPLANT
DRAPE C-ARM 42X72 X-RAY (DRAPES) ×3 IMPLANT
ELECT REM PT RETURN 9FT ADLT (ELECTROSURGICAL) ×3
ELECTRODE REM PT RTRN 9FT ADLT (ELECTROSURGICAL) ×1 IMPLANT
GLOVE BIOGEL PI IND STRL 7.0 (GLOVE) ×1 IMPLANT
GLOVE BIOGEL PI INDICATOR 7.0 (GLOVE) ×2
GLOVE SURG SS PI 7.0 STRL IVOR (GLOVE) ×3 IMPLANT
GOWN STRL REUS W/ TWL LRG LVL3 (GOWN DISPOSABLE) ×3 IMPLANT
GOWN STRL REUS W/TWL LRG LVL3 (GOWN DISPOSABLE) ×6
KIT BASIN OR (CUSTOM PROCEDURE TRAY) ×3 IMPLANT
KIT ROOM TURNOVER OR (KITS) ×3 IMPLANT
NS IRRIG 1000ML POUR BTL (IV SOLUTION) ×3 IMPLANT
PAD ARMBOARD 7.5X6 YLW CONV (MISCELLANEOUS) ×3 IMPLANT
POUCH RETRIEVAL ECOSAC 10 (ENDOMECHANICALS) ×1 IMPLANT
POUCH RETRIEVAL ECOSAC 10MM (ENDOMECHANICALS) ×2
SCISSORS LAP 5X35 DISP (ENDOMECHANICALS) ×3 IMPLANT
SET CHOLANGIOGRAPH 5 50 .035 (SET/KITS/TRAYS/PACK) ×3 IMPLANT
SET IRRIG TUBING LAPAROSCOPIC (IRRIGATION / IRRIGATOR) ×3 IMPLANT
SLEEVE ENDOPATH XCEL 5M (ENDOMECHANICALS) ×6 IMPLANT
SPECIMEN JAR SMALL (MISCELLANEOUS) ×3 IMPLANT
STRIP CLOSURE SKIN 1/2X4 (GAUZE/BANDAGES/DRESSINGS) ×2 IMPLANT
SUT MNCRL AB 4-0 PS2 18 (SUTURE) ×3 IMPLANT
TOWEL OR 17X24 6PK STRL BLUE (TOWEL DISPOSABLE) ×3 IMPLANT
TOWEL OR 17X26 10 PK STRL BLUE (TOWEL DISPOSABLE) ×3 IMPLANT
TRAY LAPAROSCOPIC MC (CUSTOM PROCEDURE TRAY) ×3 IMPLANT
TROCAR XCEL 12X100 BLDLESS (ENDOMECHANICALS) ×3 IMPLANT
TROCAR XCEL NON-BLD 5MMX100MML (ENDOMECHANICALS) ×3 IMPLANT
TUBING INSUFFLATION (TUBING) ×3 IMPLANT

## 2015-04-24 NOTE — Anesthesia Postprocedure Evaluation (Signed)
Anesthesia Post Note  Patient: Editor, commissioningJahseyah Frank  Procedure(s) Performed: Procedure(s) (LRB): LAPAROSCOPIC CHOLECYSTECTOMY WITH INTRAOPERATIVE CHOLANGIOGRAM (N/A)  Patient location during evaluation: PACU Anesthesia Type: General Level of consciousness: awake and alert Pain management: pain level controlled Vital Signs Assessment: post-procedure vital signs reviewed and stable Respiratory status: spontaneous breathing, nonlabored ventilation and respiratory function stable Cardiovascular status: blood pressure returned to baseline and stable Postop Assessment: No signs of nausea or vomiting Anesthetic complications: no    Last Vitals:  Filed Vitals:   04/24/15 1145 04/24/15 1200  BP: 133/92 131/87  Pulse:    Temp:  36.3 C  Resp:      Last Pain:  Filed Vitals:   04/24/15 1213  PainSc: Asleep                 Mekai Wilkinson,E. Oluwakemi Salsberry

## 2015-04-24 NOTE — Transfer of Care (Signed)
Immediate Anesthesia Transfer of Care Note  Patient: Kylie Frank  Procedure(s) Performed: Procedure(s): LAPAROSCOPIC CHOLECYSTECTOMY WITH INTRAOPERATIVE CHOLANGIOGRAM (N/A)  Patient Location: PACU  Anesthesia Type:General  Level of Consciousness: awake and alert   Airway & Oxygen Therapy: Patient Spontanous Breathing and Patient connected to nasal cannula oxygen  Post-op Assessment: Report given to RN, Post -op Vital signs reviewed and stable and Patient moving all extremities  Post vital signs: Reviewed and stable  Last Vitals:  Filed Vitals:   04/24/15 0537 04/24/15 0903  BP: 124/80 135/97  Pulse: 58 68  Temp: 36.6 C 36.2 C  Resp: 18 18    Complications: No apparent anesthesia complications

## 2015-04-24 NOTE — Op Note (Signed)
Preoperative diagnosis: gallstone pancreatitis  Postoperative diagnosis: Same   Procedure: laparoscopic cholecystectomy with intraoperative cholangiogram  Surgeon: Feliciana RossettiLuke Rafaelita Foister, M.D.  Asst: P.J. Carolynne Edouardoth, M.D.  Anesthesia: Gen.   Indications for procedure: Kylie Frank is a 29 y.o. female with symptoms of RUQ pain and Nausea consistent with gallbladder disease, Confirmed by Ultrasound.  Description of procedure: The patient was brought into the operative suite, placed supine. Anesthesia was administered with endotracheal tube. Patient was strapped in place and foot board was secured. All pressure points were offloaded by foam padding. The patient was prepped and draped in the usual sterile fashion.  A small incision was made to the right of the umbilicus. A 5mm trocar was inserted into the peritoneal cavity with optical entry. Pneumoperitoneum was applied with high flow low pressure. 2 5mm trocars were placed in the RUQ. A 12mm trocar was placed in the subxiphoid space. All trocars sites were first anesthesized with 0.25% marcaine with epinephrine in the subcutaneous and preperitoneal layers. Next the patient was placed in reverse trendelenberg. The gallbladder was retracted cephalad and lateral. The peritoneum was reflected off the infundibulum working lateral to medial.   The cystic duct and cystic artery were identified and further dissection revealed a critical view, due to concern for choledocholithiasis a cholangiogram was performed with cystotomy and Cook catheter. IOC shows dilated duct but no stones within the duct. The cystic duct and cystic artery were doubly clipped and ligated.   The gallbladder was removed off the liver bed with cautery. The Gallbladder was placed in a specimen bag. The gallbladder fossa was irrigated and hemostasis was applied with cautery. The gallbladder was removed via the 12mm trocar. Pneumoperitoneum was removed, all trocar were removed. All incisions were  closed with 4-0 monocryl subcuticular stitch. The patient woke from anesthesia and was brought to PACU in stable condition.  Findings: inflamed gallbladder, no choledocholithiasis  Specimen: gallbldadder  Blood loss: 50cc  Local anesthesia: 30cc 0.25%marcaine w epi  Complications: none  Feliciana RossettiLuke Chrisette Man, M.D. General, Bariatric, & Minimally Invasive Surgery [facility]

## 2015-04-24 NOTE — Progress Notes (Signed)
  Subjective: She is better, pain is less, but reports she had morphine about 1 hour ago ([id number]), still mildly tender RUQ after Morphine.  Objective: Vital signs in last 24 hours: Temp:  [97.5 F (36.4 C)-98.3 F (36.8 C)] 97.8 F (36.6 C) (11/26 0537) Pulse Rate:  [52-77] 58 (11/26 0537) Resp:  [18] 18 (11/26 0537) BP: (114-133)/(69-86) 124/80 mmHg (11/26 0537) SpO2:  [97 %-100 %] 98 % (11/26 0537) Weight:  [106.414 kg (234 lb 9.6 oz)] 106.414 kg (234 lb 9.6 oz) (11/25 2133) Last BM Date: 04/23/15 Afebrile, VSS K+ 3.3 lipase down to 146 AST 91/ALT 206 both down from yesterday NPO  Intake/Output from previous day: 11/25 0701 - 11/26 0700 In: 1200 [I.V.:1100; IV Piggyback:100] Out: 900 [Urine:900] Intake/Output this shift:    General appearance: alert, cooperative and no distress Resp: clear to auscultation bilaterally GI: soft, still somewhat tender RUQ, + BS, no distension.  Lab Results:   Recent Labs  04/22/15 2315 04/24/15 0506  WBC 8.1 4.9  HGB 11.3* 10.9*  HCT 35.9* 34.8*  PLT 302 274    BMET  Recent Labs  04/22/15 2315 04/23/15 0203 04/24/15 0506  NA 138  --  137  K 3.5  --  3.3*  CL 104  --  104  CO2 26  --  27  GLUCOSE 93  --  93  BUN 5*  --  <5*  CREATININE 1.07* 1.22* 0.92  CALCIUM 9.2  --  9.1   PT/INR No results for input(s): LABPROT, INR in the last 72 hours.   Recent Labs Lab 04/21/15 1814 04/22/15 2315 04/24/15 0506  AST 349* 284* 91*  ALT 317* 351* 205*  ALKPHOS 226* 225* 182*  BILITOT 0.7 1.1 0.7  PROT 7.6 7.6 7.0  ALBUMIN 3.9 3.9 3.5     Lipase     Component Value Date/Time   LIPASE 146* 04/24/2015 0506     Studies/Results: No results found.  Medications: . [START ON 04/26/2015] cefoTEtan (CEFOTAN) IV  2 g Intravenous To SSTC  . cefOXitin  2 g Intravenous 3 times per day  . enoxaparin (LOVENOX) injection  40 mg Subcutaneous Q24H    Assessment/Plan Gallstone pancreatitis Cholelithiasis   Plan:  Keep her  NPO and discuss with Dr. Sheliah HatchKinsinger.  Recheck labs in AM.     LOS: 1 day    Kylie Frank 04/24/2015

## 2015-04-24 NOTE — Anesthesia Preprocedure Evaluation (Addendum)
Anesthesia Evaluation  Patient identified by MRN, date of birth, ID band Patient awake    Reviewed: Allergy & Precautions, NPO status , Patient's Chart, lab work & pertinent test results  History of Anesthesia Complications Negative for: history of anesthetic complications  Airway Mallampati: II  TM Distance: >3 FB Neck ROM: Full    Dental  (+) Teeth Intact, Dental Advisory Given   Pulmonary neg pulmonary ROS,    breath sounds clear to auscultation       Cardiovascular negative cardio ROS   Rhythm:Regular Rate:Normal     Neuro/Psych negative neurological ROS     GI/Hepatic Elevated LFTs with gallstone pancreatitis Acute chole   Endo/Other  negative endocrine ROS  Renal/GU negative Renal ROS     Musculoskeletal   Abdominal   Peds  Hematology negative hematology ROS (+)   Anesthesia Other Findings 1 month postpartum, bottle feeding. Has not had a period since delivery, no sexual activity since delivery.  Reproductive/Obstetrics Has 341 month old baby, still bleeding, no sexual relations since delivery                          Anesthesia Physical Anesthesia Plan  ASA: I  Anesthesia Plan: General   Post-op Pain Management:    Induction: Intravenous and Rapid sequence  Airway Management Planned: Oral ETT  Additional Equipment:   Intra-op Plan:   Post-operative Plan: Extubation in OR  Informed Consent: I have reviewed the patients History and Physical, chart, labs and discussed the procedure including the risks, benefits and alternatives for the proposed anesthesia with the patient or authorized representative who has indicated his/her understanding and acceptance.   Dental advisory given  Plan Discussed with: CRNA and Surgeon  Anesthesia Plan Comments: (Plan routine monitors, GETA)        Anesthesia Quick Evaluation

## 2015-04-24 NOTE — Anesthesia Procedure Notes (Signed)
Procedure Name: Intubation Date/Time: 04/24/2015 10:00 AM Performed by: Suzy Bouchard Pre-anesthesia Checklist: Patient identified, Emergency Drugs available, Suction available, Timeout performed and Patient being monitored Patient Re-evaluated:Patient Re-evaluated prior to inductionOxygen Delivery Method: Circle system utilized Preoxygenation: Pre-oxygenation with 100% oxygen Intubation Type: IV induction, Cricoid Pressure applied and Rapid sequence Ventilation: Mask ventilation without difficulty Laryngoscope Size: Miller and 2 Grade View: Grade I Tube type: Oral Laser Tube: Cuffed inflated with minimal occlusive pressure - saline Tube size: 7.5 mm Number of attempts: 1 Airway Equipment and Method: Stylet and LTA kit utilized Placement Confirmation: ETT inserted through vocal cords under direct vision,  positive ETCO2 and breath sounds checked- equal and bilateral Secured at: 23 cm Tube secured with: Tape Dental Injury: Teeth and Oropharynx as per pre-operative assessment

## 2015-04-24 NOTE — Progress Notes (Signed)
Patient's pocketbook, cell phone, and she reports that she has money in pocketbook went to OR with patient. Karoline CaldwellAngie, RN and Marchelle FolksAmanda, CRNA aware of same.

## 2015-04-25 LAB — COMPREHENSIVE METABOLIC PANEL
ALT: 155 U/L — ABNORMAL HIGH (ref 14–54)
ANION GAP: 7 (ref 5–15)
AST: 69 U/L — AB (ref 15–41)
Albumin: 3.4 g/dL — ABNORMAL LOW (ref 3.5–5.0)
Alkaline Phosphatase: 173 U/L — ABNORMAL HIGH (ref 38–126)
BILIRUBIN TOTAL: 0.1 mg/dL — AB (ref 0.3–1.2)
BUN: 5 mg/dL — ABNORMAL LOW (ref 6–20)
CHLORIDE: 107 mmol/L (ref 101–111)
CO2: 24 mmol/L (ref 22–32)
Calcium: 9.4 mg/dL (ref 8.9–10.3)
Creatinine, Ser: 1 mg/dL (ref 0.44–1.00)
GFR calc Af Amer: >60 mL/min (ref >60)
GFR calc non Af Amer: >60 mL/min (ref >60)
GLUCOSE: 125 mg/dL — AB (ref 65–99)
POTASSIUM: 3.8 mmol/L (ref 3.5–5.1)
SODIUM: 138 mmol/L (ref 135–145)
TOTAL PROTEIN: 7.4 g/dL (ref 6.5–8.1)

## 2015-04-25 MED ORDER — KETOROLAC TROMETHAMINE 30 MG/ML IJ SOLN
30.0000 mg | Freq: Three times a day (TID) | INTRAMUSCULAR | Status: DC
  Administered 2015-04-25 (×2): 30 mg via INTRAVENOUS
  Filled 2015-04-25 (×2): qty 1

## 2015-04-25 MED ORDER — TRAMADOL HCL 50 MG PO TABS
50.0000 mg | ORAL_TABLET | Freq: Four times a day (QID) | ORAL | Status: DC | PRN
  Administered 2015-04-25: 50 mg via ORAL
  Filled 2015-04-25: qty 1

## 2015-04-25 MED ORDER — TRAMADOL HCL 50 MG PO TABS: 50.0000 mg | ORAL_TABLET | Freq: Four times a day (QID) | ORAL | Status: AC | PRN

## 2015-04-25 MED ORDER — OXYCODONE-ACETAMINOPHEN 5-325 MG PO TABS: 1.0000 | ORAL_TABLET | ORAL | Status: AC | PRN

## 2015-04-25 NOTE — Progress Notes (Signed)
1 Day Post-Op  Subjective: Complains of pain. Eating breakfast  Objective: Vital signs in last 24 hours: Temp:  [97.2 F (36.2 C)-98.3 F (36.8 C)] 98.3 F (36.8 C) (11/27 0605) Pulse Rate:  [68-93] 88 (11/27 0605) Resp:  [12-18] 16 (11/27 0605) BP: (110-135)/(77-97) 133/88 mmHg (11/27 0605) SpO2:  [96 %-99 %] 99 % (11/27 0605) Last BM Date: 04/23/15  Intake/Output from previous day: 11/26 0701 - 11/27 0700 In: 2492.5 [P.O.:480; I.V.:1862.5; IV Piggyback:150] Out: 3002 [Urine:3002] Intake/Output this shift:    Resp: clear to auscultation bilaterally Cardio: regular rate and rhythm GI: soft, appropriately tender. no nausea  Lab Results:   Recent Labs  04/22/15 2315 04/24/15 0506  WBC 8.1 4.9  HGB 11.3* 10.9*  HCT 35.9* 34.8*  PLT 302 274   BMET  Recent Labs  04/24/15 0506 04/25/15 0436  NA 137 138  K 3.3* 3.8  CL 104 107  CO2 27 24  GLUCOSE 93 125*  BUN <5* 5*  CREATININE 0.92 1.00  CALCIUM 9.1 9.4   PT/INR No results for input(s): LABPROT, INR in the last 72 hours. ABG No results for input(s): PHART, HCO3 in the last 72 hours.  Invalid input(s): PCO2, PO2  Studies/Results: Dg Cholangiogram Operative  04/24/2015  CLINICAL DATA:  Intraoperative cholangiogram EXAM: INTRAOPERATIVE CHOLANGIOGRAM TECHNIQUE: Cholangiographic images from the C-arm fluoroscopic device were submitted for interpretation post-operatively. Please see the procedural report for the amount of contrast and the fluoroscopy time utilized. COMPARISON:  None. FINDINGS: Forty-five intraoperative cholangiogram images submitted. There is contrast opacification of CBD, cystic duct intrahepatic ducts and contrast is noted progressing in duodenum. Mild prominent CBD. No CBD filling defects. No contrast extravasation. The patient is status postcholecystectomy. IMPRESSION: There is contrast opacification of CBD, cystic duct intrahepatic ducts and contrast is noted progressing in duodenum. Mild  prominent CBD. No CBD filling defects. No contrast extravasation. Fluoroscopy time 8.8 seconds.  Please see the operative report. Electronically Signed   By: Natasha MeadLiviu  Pop M.D.   On: 04/24/2015 11:39    Anti-infectives: Anti-infectives    Start     Dose/Rate Route Frequency Ordered Stop   04/26/15 0600  cefoTEtan (CEFOTAN) 2 g in dextrose 5 % 50 mL IVPB  Status:  Discontinued     2 g 100 mL/hr over 30 Minutes Intravenous To Short Stay 04/24/15 0711 04/24/15 0853   04/24/15 0900  cefoTEtan (CEFOTAN) 2 g in dextrose 5 % 50 mL IVPB  Status:  Discontinued     2 g 100 mL/hr over 30 Minutes Intravenous To Short Stay 04/24/15 0853 04/24/15 0930   04/23/15 0600  cefOXitin (MEFOXIN) 2 g in dextrose 5 % 50 mL IVPB     2 g 100 mL/hr over 30 Minutes Intravenous 3 times per day 04/23/15 0140     04/23/15 0100  cefOXitin (MEFOXIN) 2 g in dextrose 5 % 50 mL IVPB  Status:  Discontinued     2 g 100 mL/hr over 30 Minutes Intravenous 4 times per day 04/23/15 0059 04/23/15 0140      Assessment/Plan: s/p Procedure(s): LAPAROSCOPIC CHOLECYSTECTOMY WITH INTRAOPERATIVE CHOLANGIOGRAM (N/A) Advance diet  Consider discharge later today  LOS: 2 days    TOTH III,Townes Fuhs S 04/25/2015

## 2015-04-25 NOTE — Progress Notes (Signed)
Physician Discharge Summary  Patient ID: Kylie Frank MRN: [id number] DOB/AGE: [birth year] 29 y.o.  Admit date: 04/22/2015 Discharge date: 04/25/2015  Admission Diagnoses:  Gallstone pancreatitis/cholelithiasis Post partum  Discharge Diagnoses:  Same     Active Problems:   Gallstone pancreatitis   PROCEDURES: laparoscopic cholecystectomy with intraoperative cholangiogram, 04/24/15, Dr. Rolley Sims Course:  29 yo female with 1 week of RUQ pain. She has had no previous attacks with similar pains. She has had nausea and vomiting for the last 2 days. Pain is cramping and sharp in nature, has been constant for last 2 days. She initially went to the ER and was sent home with presumed symp gallstones, but returned today for worsening pain. No fevers or chills, Last BM today. She recently had a baby 1 month ago with vaginal delivery. Pt was admitted and her lipase went down to 146 after admit.  She was taken to the OR and had procedure above.  IOC was normal.  She was seen the following AM by Dr. Carolynne Edouard and discharged home.    Condition on d/c: improved      Disposition: 01-Home or Self Care  Discharge Instructions    Call MD for:  difficulty breathing, headache or visual disturbances    Complete by:  As directed      Call MD for:  extreme fatigue    Complete by:  As directed      Call MD for:  hives    Complete by:  As directed      Call MD for:  persistant dizziness or light-headedness    Complete by:  As directed      Call MD for:  persistant nausea and vomiting    Complete by:  As directed      Call MD for:  redness, tenderness, or signs of infection (pain, swelling, redness, odor or green/yellow discharge around incision site)    Complete by:  As directed      Call MD for:  severe uncontrolled pain    Complete by:  As directed      Call MD for:  temperature >100.4    Complete by:  As directed      Diet - low sodium heart healthy    Complete by:  As directed       Discharge instructions    Complete by:  As directed   May shower. Low fat diet. No heavy lifting     Increase activity slowly    Complete by:  As directed      No wound care    Complete by:  As directed             Medication List    TAKE these medications        ondansetron 4 MG disintegrating tablet  Commonly known as:  ZOFRAN ODT  Take 1 tablet (4 mg total) by mouth every 8 (eight) hours as needed for nausea or vomiting.     oxyCODONE-acetaminophen 5-325 MG tablet  Commonly known as:  ROXICET  Take 1-2 tablets by mouth every 4 (four) hours as needed.     traMADol 50 MG tablet  Commonly known as:  ULTRAM  Take 1-2 tablets (50-100 mg total) by mouth every 6 (six) hours as needed.           Follow-up Information    Follow up with [facility] In 2 weeks.   Specialty:  General Surgery   Why:  Call Monday and ask for  DOW clinic appointment in 3 weeks.   Contact information:   [address] [place] [address] [phone]       Signed: Sherrie GeorgeJENNINGS,Jaedan Huttner 04/25/2015, 9:41 AM

## 2015-04-25 NOTE — Progress Notes (Signed)
Discharge instructions gone over with patient. Home medications gone over with patient. Prescriptions given. Follow up appointment to be made. Diet, activity, incisional care, and reasons to call the doctor gone over. Patient verbalized understanding of instructions. My chart discussed.

## 2015-04-25 NOTE — Discharge Instructions (Signed)
[facility] ______[facility] °LAPAROSCOPIC SURGERY: POST OP INSTRUCTIONS °Always review your discharge instruction sheet given to you by the facility where your surgery was performed. °IF YOU HAVE DISABILITY OR FAMILY LEAVE FORMS, YOU MUST BRING THEM TO THE OFFICE FOR PROCESSING.   °DO NOT GIVE THEM TO YOUR DOCTOR. ° °1. A prescription for pain medication may be given to you upon discharge.  Take your pain medication as prescribed, if needed.  If narcotic pain medicine is not needed, then you may take acetaminophen (Tylenol) or ibuprofen (Advil) as needed. °2. Take your usually prescribed medications unless otherwise directed. °3. If you need a refill on your pain medication, please contact your pharmacy.  They will contact our office to request authorization. Prescriptions will not be filled after 5pm or on week-ends. °4. You should follow a light diet the first few days after arrival home, such as soup and crackers, etc.  Be sure to include lots of fluids daily. °5. Most patients will experience some swelling and bruising in the area of the incisions.  Ice packs will help.  Swelling and bruising can take several days to resolve.  °6. It is common to experience some constipation if taking pain medication after surgery.  Increasing fluid intake and taking a stool softener (such as Colace) will usually help or prevent this problem from occurring.  A mild laxative (Milk of Magnesia or Miralax) should be taken according to package instructions if there are no bowel movements after 48 hours. °7. Unless discharge instructions indicate otherwise, you may remove your bandages 24-48 hours after surgery, and you may shower at that time.  You may have steri-strips (small skin tapes) in place directly over the incision.  These strips should be left on the skin for 7-10 days.  If your surgeon used skin glue on the incision, you may shower in 24 hours.  The glue will flake off over the next 2-3 weeks.  Any sutures or  staples will be removed at the office during your follow-up visit. °8. ACTIVITIES:  You may resume regular (light) daily activities beginning the next day--such as daily self-care, walking, climbing stairs--gradually increasing activities as tolerated.  You may have sexual intercourse when it is comfortable.  Refrain from any heavy lifting or straining until approved by your doctor. °a. You may drive when you are no longer taking prescription pain medication, you can comfortably wear a seatbelt, and you can safely maneuver your car and apply brakes. °b. RETURN TO WORK:  __________________________________________________________ °9. You should see your doctor in the office for a follow-up appointment approximately 2-3 weeks after your surgery.  Make sure that you call for this appointment within a day or two after you arrive home to insure a convenient appointment time. °10. OTHER INSTRUCTIONS: __________________________________________________________________________________________________________________________ __________________________________________________________________________________________________________________________ °WHEN TO CALL YOUR DOCTOR: °1. Fever over 101.0 °2. Inability to urinate °3. Continued bleeding from incision. °4. Increased pain, redness, or drainage from the incision. °5. Increasing abdominal pain ° °The clinic staff is available to answer your questions during regular business hours.  Please don’t hesitate to call and ask to speak to one of the nurses for clinical concerns.  If you have a medical emergency, go to the nearest emergency room or call 911.  A surgeon from [facility] is always on call at the hospital. °[address], [address], [place], [place]  [address] ? [address] [address], [place], [place]   [address] °[phone] ? [phone] ? FAX [phone] °Web site:   [email or web address] °

## 2015-04-26 ENCOUNTER — Encounter (HOSPITAL_COMMUNITY): Payer: Self-pay | Admitting: General Surgery

## 2015-04-26 ENCOUNTER — Ambulatory Visit: Admit: 2015-04-26 | Payer: Self-pay | Admitting: General Surgery

## 2015-04-26 SURGERY — LAPAROSCOPIC CHOLECYSTECTOMY WITH INTRAOPERATIVE CHOLANGIOGRAM
Anesthesia: General

## 2015-05-03 NOTE — Discharge Summary (Signed)
Patient ID: Kylie Frank MRN: [id number] DOB/AGE: 1986-04-19 29 y.o.  Admit date: 04/22/2015 Discharge date: 04/25/2015  Admission Diagnoses:  Gallstone pancreatitis/cholelithiasis Post partum  Discharge Diagnoses:  Same    Active Problems:  Gallstone pancreatitis   PROCEDURES: laparoscopic cholecystectomy with intraoperative cholangiogram, 04/24/15, Dr. Rolley Sims Course:  29 yo female with 1 week of RUQ pain. She has had no previous attacks with similar pains. She has had nausea and vomiting for the last 2 days. Pain is cramping and sharp in nature, has been constant for last 2 days. She initially went to the ER and was sent home with presumed symp gallstones, but returned today for worsening pain. No fevers or chills, Last BM today. She recently had a baby 1 month ago with vaginal delivery. Pt was admitted and her lipase went down to 146 after admit. She was taken to the OR and had procedure above. IOC was normal. She was seen the following AM by Dr. Carolynne Edouard and discharged home.   Condition on d/c: improved     Disposition: 01-Home or Self Care  Discharge Instructions    Call MD for: difficulty breathing, headache or visual disturbances  Complete by: As directed      Call MD for: extreme fatigue  Complete by: As directed      Call MD for: hives  Complete by: As directed      Call MD for: persistant dizziness or light-headedness  Complete by: As directed      Call MD for: persistant nausea and vomiting  Complete by: As directed      Call MD for: redness, tenderness, or signs of infection (pain, swelling, redness, odor or green/yellow discharge around incision site)  Complete by: As directed      Call MD for: severe uncontrolled pain  Complete by: As directed      Call MD for: temperature >100.4  Complete by: As directed      Diet - low sodium heart healthy  Complete by:  As directed      Discharge instructions  Complete by: As directed   May shower. Low fat diet. No heavy lifting     Increase activity slowly  Complete by: As directed      No wound care  Complete by: As directed             Medication List    TAKE these medications       ondansetron 4 MG disintegrating tablet  Commonly known as: ZOFRAN ODT  Take 1 tablet (4 mg total) by mouth every 8 (eight) hours as needed for nausea or vomiting.     oxyCODONE-acetaminophen 5-325 MG tablet  Commonly known as: ROXICET  Take 1-2 tablets by mouth every 4 (four) hours as needed.     traMADol 50 MG tablet  Commonly known as: ULTRAM  Take 1-2 tablets (50-100 mg total) by mouth every 6 (six) hours as needed.           Follow-up Information    Follow up with [facility] In 2 weeks.   Specialty: General Surgery   Why: Call Monday and ask for DOW clinic appointment in 3 weeks.   Contact information:   [address] [place] [address] [phone]       Signed: Sherrie George 04/25/2015, 9:41 AM         Cosigned by: Chevis Pretty III, MD at 04/25/2015 9:54 AM  Revision History     Date/Time User Provider  Type Action   04/25/2015 9:54 AM Griselda MinerPaul Toth III, MD Physician Cosign   04/25/2015 9:44 AM Sherrie GeorgeWillard Collyns Mcquigg, PA-C Physician Assistant Sign

## 2016-09-05 ENCOUNTER — Other Ambulatory Visit: Payer: Self-pay | Admitting: Obstetrics and Gynecology

## 2016-09-06 LAB — CYTOLOGY - PAP

## 2017-03-20 ENCOUNTER — Emergency Department (HOSPITAL_COMMUNITY): Payer: BLUE CROSS/BLUE SHIELD

## 2017-03-20 ENCOUNTER — Encounter (HOSPITAL_COMMUNITY): Payer: Self-pay

## 2017-03-20 ENCOUNTER — Emergency Department (HOSPITAL_COMMUNITY)
Admission: EM | Admit: 2017-03-20 | Discharge: 2017-03-20 | Discharge status: Against Medical Advice | Payer: BLUE CROSS/BLUE SHIELD | Attending: Emergency Medicine | Admitting: Emergency Medicine

## 2017-03-20 DIAGNOSIS — Z5320 Procedure and treatment not carried out because of patient's decision for unspecified reasons: Secondary | ICD-10-CM | POA: Diagnosis not present

## 2017-03-20 DIAGNOSIS — R0602 Shortness of breath: Secondary | ICD-10-CM | POA: Insufficient documentation

## 2017-03-20 DIAGNOSIS — Z5329 Procedure and treatment not carried out because of patient's decision for other reasons: Secondary | ICD-10-CM

## 2017-03-20 LAB — BASIC METABOLIC PANEL
ANION GAP: 8 (ref 5–15)
BUN: 9 mg/dL (ref 6–20)
CALCIUM: 8.9 mg/dL (ref 8.9–10.3)
CO2: 23 mmol/L (ref 22–32)
Chloride: 107 mmol/L (ref 101–111)
Creatinine, Ser: 0.93 mg/dL (ref 0.44–1.00)
GFR calc Af Amer: >60 mL/min (ref >60)
GLUCOSE: 83 mg/dL (ref 65–99)
Potassium: 3.7 mmol/L (ref 3.5–5.1)
SODIUM: 138 mmol/L (ref 135–145)

## 2017-03-20 LAB — CBC
HCT: 37.1 % (ref 36.0–46.0)
Hemoglobin: 11.7 g/dL — ABNORMAL LOW (ref 12.0–15.0)
MCH: 27.8 pg (ref 26.0–34.0)
MCHC: 31.5 g/dL (ref 30.0–36.0)
MCV: 88.1 fL (ref 78.0–100.0)
Platelets: 332 10*3/uL (ref 150–400)
RBC: 4.21 MIL/uL (ref 3.87–5.11)
RDW: 13.3 % (ref 11.5–15.5)
WBC: 7.1 10*3/uL (ref 4.0–10.5)

## 2017-03-20 LAB — I-STAT TROPONIN, ED: TROPONIN I, POC: 0 ng/mL (ref 0.00–0.08)

## 2017-03-20 LAB — D-DIMER, QUANTITATIVE: D-Dimer, Quant: 0.38 ug/mL-FEU (ref 0.00–0.50)

## 2017-03-20 MED ORDER — PREDNISONE 20 MG PO TABS
60.0000 mg | ORAL_TABLET | Freq: Once | ORAL | Status: AC
  Administered 2017-03-20: 60 mg via ORAL
  Filled 2017-03-20: qty 3

## 2017-03-20 MED ORDER — ALBUTEROL SULFATE HFA 108 (90 BASE) MCG/ACT IN AERS
2.0000 | INHALATION_SPRAY | Freq: Once | RESPIRATORY_TRACT | Status: AC
  Administered 2017-03-20: 2 via RESPIRATORY_TRACT
  Filled 2017-03-20: qty 6.7

## 2017-03-20 MED ORDER — PREDNISONE 50 MG PO TABS: ORAL_TABLET | ORAL | 0 refills | Status: AC

## 2017-03-20 NOTE — Discharge Instructions (Signed)
Your leaving AGAINST MEDICAL ADVICE. Please return to the emergency department at any time.

## 2017-03-20 NOTE — ED Triage Notes (Signed)
Pt states she has had exertional shortness of breath that began Friday. She does take oral contraceptives but denies long travel. Skin warm and dry. No hx of asthma. She states she gets out of breath doing simple tasks such as showering.

## 2017-03-20 NOTE — ED Provider Notes (Signed)
[facility] EMERGENCY DEPARTMENT Provider Note   CSN: [id number] Arrival date & time: 03/20/17  1127     History   Chief Complaint Chief Complaint  Patient presents with  . Shortness of Breath   HPI  Blood pressure 132/86, pulse 73, temperature 98.6 F (37 C), temperature source Oral, resp. rate (!) 22, height 6\' 1"  (1.854 m), weight 106.1 kg (234 lb), last menstrual period 01/18/2017, SpO2 100 %.  Kylie Frank is a 31 y.o. female complaining of shortness of breath onset 4 days ago worse when she lays down at night, she also states that she had to sit down while in the shower because it was so severe. She has some very mild nonpleuritic, nonexertional chest pains associated with this she denies cough, hemoptysis, palpitations, syncope. She denies prior history of DVT/PE, recent immobilizations, calf pain, leg swelling, family history of early cardiac death, cocaine or methamphetamine use. She states that she does have heavy menses but has never required a transfusion or iron supplementation or have been told that she is anemic in the past. She doesn't smoke and doesn't have a history of asthma.  History reviewed. No pertinent past medical history.  Patient Active Problem List   Diagnosis Date Noted  . Gallstone pancreatitis 04/23/2015    Past Surgical History:  Procedure Laterality Date  . CHOLECYSTECTOMY N/A 04/24/2015   Procedure: LAPAROSCOPIC CHOLECYSTECTOMY WITH INTRAOPERATIVE CHOLANGIOGRAM;  Surgeon: De Blanch Kinsinger, MD;  Location: [facility] OR;  Service: General;  Laterality: N/A;    OB History    No data available       Home Medications    Prior to Admission medications   Medication Sig Start Date End Date Taking? Authorizing Provider  ondansetron (ZOFRAN ODT) 4 MG disintegrating tablet Take 1 tablet (4 mg total) by mouth every 8 (eight) hours as needed for nausea or vomiting. 04/21/15   Maris Berger, MD  oxyCODONE-acetaminophen (ROXICET)  5-325 MG tablet Take 1-2 tablets by mouth every 4 (four) hours as needed. 04/25/15   Griselda Miner, MD  predniSONE (DELTASONE) 50 MG tablet Take 1 tablet daily with breakfast 03/20/17   Lyllian Gause, Joni Reining, PA-C  traMADol (ULTRAM) 50 MG tablet Take 1-2 tablets (50-100 mg total) by mouth every 6 (six) hours as needed. 04/25/15   Griselda Miner, MD    Family History History reviewed. No pertinent family history.  Social History Social History  Substance Use Topics  . Smoking status: Never Smoker  . Smokeless tobacco: Never Used  . Alcohol use Yes     Comment: occasional     Allergies   Bee venom   Review of Systems Review of Systems  A complete review of systems was obtained and all systems are negative except as noted in the HPI and PMH.    Physical Exam Updated Vital Signs BP 132/86   Pulse 73   Temp 98.6 F (37 C) (Oral)   Resp (!) 22   Ht 6\' 1"  (1.854 m)   Wt 106.1 kg (234 lb)   LMP 01/18/2017 (Approximate) Comment: Periods q3 months due to birth control   SpO2 100%   BMI 30.87 kg/m   Physical Exam  Constitutional: She is oriented to person, place, and time. She appears well-developed and well-nourished. No distress.  HENT:  Head: Normocephalic.  Mouth/Throat: Oropharynx is clear and moist.  Eyes: Conjunctivae are normal.  Neck: Normal range of motion. No JVD present. No tracheal deviation present.  Cardiovascular: Normal rate, regular rhythm and  intact distal pulses.   Radial pulse equal bilaterally  Pulmonary/Chest: Effort normal and breath sounds normal. No stridor. No respiratory distress. She has no wheezes. She has no rales. She exhibits no tenderness.  Abdominal: Soft. She exhibits no distension and no mass. There is no tenderness. There is no rebound and no guarding.  Musculoskeletal: Normal range of motion. She exhibits no edema or tenderness.  No calf asymmetry, superficial collaterals, palpable cords, edema, Homans sign negative bilaterally.      Neurological: She is alert and oriented to person, place, and time.  Skin: Skin is warm. She is not diaphoretic.  Psychiatric: She has a normal mood and affect.  Nursing note and vitals reviewed.    ED Treatments / Results  Labs (all labs ordered are listed, but only abnormal results are displayed) Labs Reviewed  CBC - Abnormal; Notable for the following:       Result Value   Hemoglobin 11.7 (*)    All other components within normal limits  BASIC METABOLIC PANEL  D-DIMER, QUANTITATIVE (NOT [facility])  I-STAT TROPONIN, ED    EKG  EKG Interpretation  Date/Time:  Tuesday March 20 2017 11:29:24 EDT Ventricular Rate:  85 PR Interval:  176 QRS Duration: 88 QT Interval:  364 QTC Calculation: 433 R Axis:   71 Text Interpretation:  Normal sinus rhythm Nonspecific T wave abnormality Abnormal ECG No old tracing to compare Confirmed by Melene PlanFloyd, Dan [id number]) on 03/20/2017 4:13:44 PM       Radiology Dg Chest 2 View  Result Date: 03/20/2017 CLINICAL DATA:  Exertional shortness of breast since Friday EXAM: CHEST  2 VIEW COMPARISON:  None FINDINGS: Normal heart size, mediastinal contours, and pulmonary vascularity. Minimal central peribronchial thickening. No pulmonary infiltrate, pleural effusion, or pneumothorax. Bones unremarkable. IMPRESSION: Minimal bronchitic changes without acute infiltrate. Electronically Signed   By: Ulyses SouthwardMark  Boles M.D.   On: 03/20/2017 12:28    Procedures Procedures (including critical care time)  Medications Ordered in ED Medications  predniSONE (DELTASONE) tablet 60 mg (60 mg Oral Given 03/20/17 1609)  albuterol (PROVENTIL HFA;VENTOLIN HFA) 108 (90 Base) MCG/ACT inhaler 2 puff (2 puffs Inhalation Given 03/20/17 1609)     Initial Impression / Assessment and Plan / ED Course  I have reviewed the triage vital signs and the nursing notes.  Pertinent labs & imaging results that were available during my care of the patient were reviewed by me and considered in  my medical decision making (see chart for details).     Vitals:   03/20/17 1146 03/20/17 1147 03/20/17 1454 03/20/17 1600  BP: 121/81  123/78 132/86  Pulse: 78  78 73  Resp: 16  16 (!) 22  Temp: 98.6 F (37 C)     TempSrc: Oral     SpO2: 97%  100% 100%  Weight:  106.1 kg (234 lb)    Height:  6\' 1"  (1.854 m)      Medications  predniSONE (DELTASONE) tablet 60 mg (60 mg Oral Given 03/20/17 1609)  albuterol (PROVENTIL HFA;VENTOLIN HFA) 108 (90 Base) MCG/ACT inhaler 2 puff (2 puffs Inhalation Given 03/20/17 1609)    Ria BushJahseyah Lundquist is 31 y.o. female presenting with shortness of breath and mild chest pain over the course of the last 3 days. Lung sounds clear to auscultation, chest x-rays without infiltrate, dores no some mild bronchitic changes. No tachypnea or tachycardia, patient is on birth control. Patient states that she has to leave the ED to pick up her son. I will  draw a d-dimer, she understands that the risk of leaving the emergency department is death or disability. She is alert, oriented 3, not clinically intoxicated and has capacity for decision making and can meaningfully discussed the pros and cons of leaving the ED. I will call her if the d-dimer is abnormal and she understands she is to return to the emergency department immediately for a CAT scan.  We will call the patient on her cell phone number when the d-dimer results [phone]  D-dimer resulted as normal, attempted to call this patient with the news and advised her to continue her bronchitis treatment with prednisone and albuterol, left voicemail and I await her return phone call.   Final Clinical Impressions(s) / ED Diagnoses   Final diagnoses:  SOB (shortness of breath)  Left against medical advice    New Prescriptions Discharge Medication List as of 03/20/2017  4:04 PM    START taking these medications   Details  predniSONE (DELTASONE) 50 MG tablet Take 1 tablet daily with breakfast, Print           Wynetta Emery, PA-C 03/20/17 1729    Melene Plan, DO 03/20/17 2147

## 2017-06-02 ENCOUNTER — Emergency Department (HOSPITAL_COMMUNITY): Payer: BLUE CROSS/BLUE SHIELD

## 2017-06-02 ENCOUNTER — Encounter (HOSPITAL_COMMUNITY): Payer: Self-pay | Admitting: Emergency Medicine

## 2017-06-02 ENCOUNTER — Emergency Department (HOSPITAL_COMMUNITY)
Admission: EM | Admit: 2017-06-02 | Discharge: 2017-06-02 | Disposition: A | Discharge status: Home / Self Care | Payer: BLUE CROSS/BLUE SHIELD | Attending: Emergency Medicine | Admitting: Emergency Medicine

## 2017-06-02 DIAGNOSIS — Z79899 Other long term (current) drug therapy: Secondary | ICD-10-CM | POA: Diagnosis not present

## 2017-06-02 DIAGNOSIS — R079 Chest pain, unspecified: Secondary | ICD-10-CM | POA: Diagnosis present

## 2017-06-02 LAB — BASIC METABOLIC PANEL
ANION GAP: 5 (ref 5–15)
BUN: 10 mg/dL (ref 6–20)
CALCIUM: 9.1 mg/dL (ref 8.9–10.3)
CHLORIDE: 106 mmol/L (ref 101–111)
CO2: 26 mmol/L (ref 22–32)
Creatinine, Ser: 0.96 mg/dL (ref 0.44–1.00)
GFR calc non Af Amer: >60 mL/min (ref >60)
Glucose, Bld: 70 mg/dL (ref 65–99)
Potassium: 3.7 mmol/L (ref 3.5–5.1)
SODIUM: 137 mmol/L (ref 135–145)

## 2017-06-02 LAB — I-STAT BETA HCG BLOOD, ED (MC, WL, AP ONLY)

## 2017-06-02 LAB — CBC
HCT: 38.8 % (ref 36.0–46.0)
HEMOGLOBIN: 12.6 g/dL (ref 12.0–15.0)
MCH: 29 pg (ref 26.0–34.0)
MCHC: 32.5 g/dL (ref 30.0–36.0)
MCV: 89.4 fL (ref 78.0–100.0)
Platelets: 286 10*3/uL (ref 150–400)
RBC: 4.34 MIL/uL (ref 3.87–5.11)
RDW: 12.9 % (ref 11.5–15.5)
WBC: 5.9 10*3/uL (ref 4.0–10.5)

## 2017-06-02 LAB — I-STAT TROPONIN, ED: TROPONIN I, POC: 0 ng/mL (ref 0.00–0.08)

## 2017-06-02 MED ORDER — NAPROXEN 500 MG PO TABS: 500.0000 mg | ORAL_TABLET | Freq: Two times a day (BID) | ORAL | 0 refills | Status: AC

## 2017-06-02 NOTE — ED Notes (Signed)
Pt stated she is urinating more frequently than usual.

## 2017-06-02 NOTE — ED Triage Notes (Addendum)
Patient c/o generalized chest pains that have been constant over past several days with SOB when fussing at her kids. Reports that pain is worse with laying down but denies n/v/ cough. Also c/o RLQ pain intermittently over past couple days, denies any urinary problems.

## 2017-06-02 NOTE — Discharge Instructions (Signed)
Naproxen for pain.  Recheck with your primary care for not improving.

## 2017-06-02 NOTE — ED Provider Notes (Signed)
[facility]-EMERGENCY DEPT Provider Note   CSN: [id number] Arrival date & time: 06/02/17  0950     History   Chief Complaint Chief Complaint  Patient presents with  . Chest Pain  . RLQ pain    HPI Kylie Frank is a 32 y.o. female. CC:  Chest pain, right side pain  HPI:  32 year old female. States she has some areas of pain in her left chest. They come and go. They last a few seconds at a time. Not pleuritic. Not really predictable. She noticed one yesterday when she was "chasing my kids around". No exertional pain. No shortness of breath. No cough or fever.  Also describes some discomfort in her right back and flank. This also is unpredictable. Has happened twice once 2 weeks ago when she has today. Sharp and sudden. Last few seconds. No dysuria. No hematuria. No history of stones. History of previous cholecystitis gallstone pancreatitis. She is status post cholecystectomy. She states she is "all the time. Has no food limitations.  History reviewed. No pertinent past medical history.  Patient Active Problem List   Diagnosis Date Noted  . Gallstone pancreatitis 04/23/2015    Past Surgical History:  Procedure Laterality Date  . CHOLECYSTECTOMY N/A 04/24/2015   Procedure: LAPAROSCOPIC CHOLECYSTECTOMY WITH INTRAOPERATIVE CHOLANGIOGRAM;  Surgeon: De Blanch Kinsinger, MD;  Location: [facility] OR;  Service: General;  Laterality: N/A;    OB History    No data available       Home Medications    Prior to Admission medications   Medication Sig Start Date End Date Taking? Authorizing Provider  naproxen (NAPROSYN) 500 MG tablet Take 1 tablet (500 mg total) by mouth 2 (two) times daily. 06/02/17   Rolland Porter, MD  ondansetron (ZOFRAN ODT) 4 MG disintegrating tablet Take 1 tablet (4 mg total) by mouth every 8 (eight) hours as needed for nausea or vomiting. 04/21/15   Maris Berger, MD  oxyCODONE-acetaminophen (ROXICET) 5-325 MG tablet Take 1-2 tablets by mouth every  4 (four) hours as needed. 04/25/15   Griselda Miner, MD  predniSONE (DELTASONE) 50 MG tablet Take 1 tablet daily with breakfast 03/20/17   Pisciotta, Joni Reining, PA-C  traMADol (ULTRAM) 50 MG tablet Take 1-2 tablets (50-100 mg total) by mouth every 6 (six) hours as needed. 04/25/15   Griselda Miner, MD    Family History No family history on file.  Social History Social History   Tobacco Use  . Smoking status: Never Smoker  . Smokeless tobacco: Never Used  Substance Use Topics  . Alcohol use: Yes    Comment: occasional  . Drug use: No     Allergies   Bee venom   Review of Systems Review of Systems  Constitutional: Negative for appetite change, chills, diaphoresis, fatigue and fever.  HENT: Negative for mouth sores, sore throat and trouble swallowing.   Eyes: Negative for visual disturbance.  Respiratory: Negative for cough, chest tightness, shortness of breath and wheezing.   Cardiovascular: Positive for chest pain.  Gastrointestinal: Negative for abdominal distention, abdominal pain, diarrhea, nausea and vomiting.  Endocrine: Negative for polydipsia, polyphagia and polyuria.  Genitourinary: Positive for flank pain. Negative for dysuria, frequency and hematuria.  Musculoskeletal: Negative for gait problem.  Skin: Negative for color change, pallor and rash.  Neurological: Negative for dizziness, syncope, light-headedness and headaches.  Hematological: Does not bruise/bleed easily.  Psychiatric/Behavioral: Negative for behavioral problems and confusion.     Physical Exam Updated Vital Signs BP 124/84 (BP Location: Left  Arm)   Pulse 72   Temp 98.6 F (37 C) (Oral)   Resp 13   Ht 6\' 1"  (1.854 m)   Wt 104.3 kg (230 lb)   SpO2 100%   BMI 30.34 kg/m   Physical Exam  Constitutional: She appears well-developed and well-nourished. No distress.  HENT:  Head: Normocephalic and atraumatic.  Eyes: Conjunctivae are normal.  Neck: Neck supple.  Cardiovascular: Normal rate and  regular rhythm.  No murmur heard. Pulmonary/Chest: Effort normal and breath sounds normal. No respiratory distress.  Abdominal: Soft. There is no tenderness.  Musculoskeletal: She exhibits no edema.  Neurological: She is alert.  Skin: Skin is warm and dry.  Psychiatric: She has a normal mood and affect.  Nursing note and vitals reviewed.    ED Treatments / Results  Labs (all labs ordered are listed, but only abnormal results are displayed) Labs Reviewed  BASIC METABOLIC PANEL  CBC  I-STAT TROPONIN, ED  I-STAT BETA HCG BLOOD, ED ([facility], [facility], [facility] ONLY)    EKG  EKG Interpretation  Date/Time:  Saturday June 02 2017 09:59:42 EST Ventricular Rate:  88 PR Interval:    QRS Duration: 96 QT Interval:  364 QTC Calculation: 441 R Axis:   79 Text Interpretation:  Sinus rhythm No significant change since last tracing Confirmed by Linwood DibblesKnapp, Jon [id number]) on 06/02/2017 10:06:53 AM       Radiology Dg Chest 2 View  Result Date: 06/02/2017 CLINICAL DATA:  Patient with chest pain for 3 days. EXAM: CHEST  2 VIEW COMPARISON:  S chest radiograph 03/20/2017. FINDINGS: Normal cardiac and mediastinal contours. No consolidative pulmonary opacities. No pleural effusion or pneumothorax. Regional skeleton is unremarkable. IMPRESSION: No acute cardiopulmonary process. Electronically Signed   By: Annia Beltrew  Davis M.D.   On: 06/02/2017 10:44    Procedures Procedures (including critical care time)  Medications Ordered in ED Medications - No data to display   Initial Impression / Assessment and Plan / ED Course  I have reviewed the triage vital signs and the nursing notes.  Pertinent labs & imaging results that were available during my care of the patient were reviewed by me and considered in my medical decision making (see chart for details).     Normal exam. Normal EKG. Normal chest x-ray. Normal labs. Fleeting episodes of pain per likely muscular in the flank. Pleuritic versus muscular the chest. No  indication for further studies. No other concerning symptoms or findings. Plan naproxen. Recheck as needed.  Final Clinical Impressions(s) / ED Diagnoses   Final diagnoses:  Chest pain, unspecified type    ED Discharge Orders        Ordered    naproxen (NAPROSYN) 500 MG tablet  2 times daily     06/02/17 1727       Rolland PorterJames, Shanekqua Schaper, MD 06/02/17 1727

## 2019-08-13 IMAGING — CR DG CHEST 2V
2 series · 2 of 2 positions shown · non-contrast
Comparison: S chest radiograph 03/20/2017.

CLINICAL DATA: Patient with chest pain for 3 days.

EXAM:
CHEST  2 VIEW

[w chest pa]
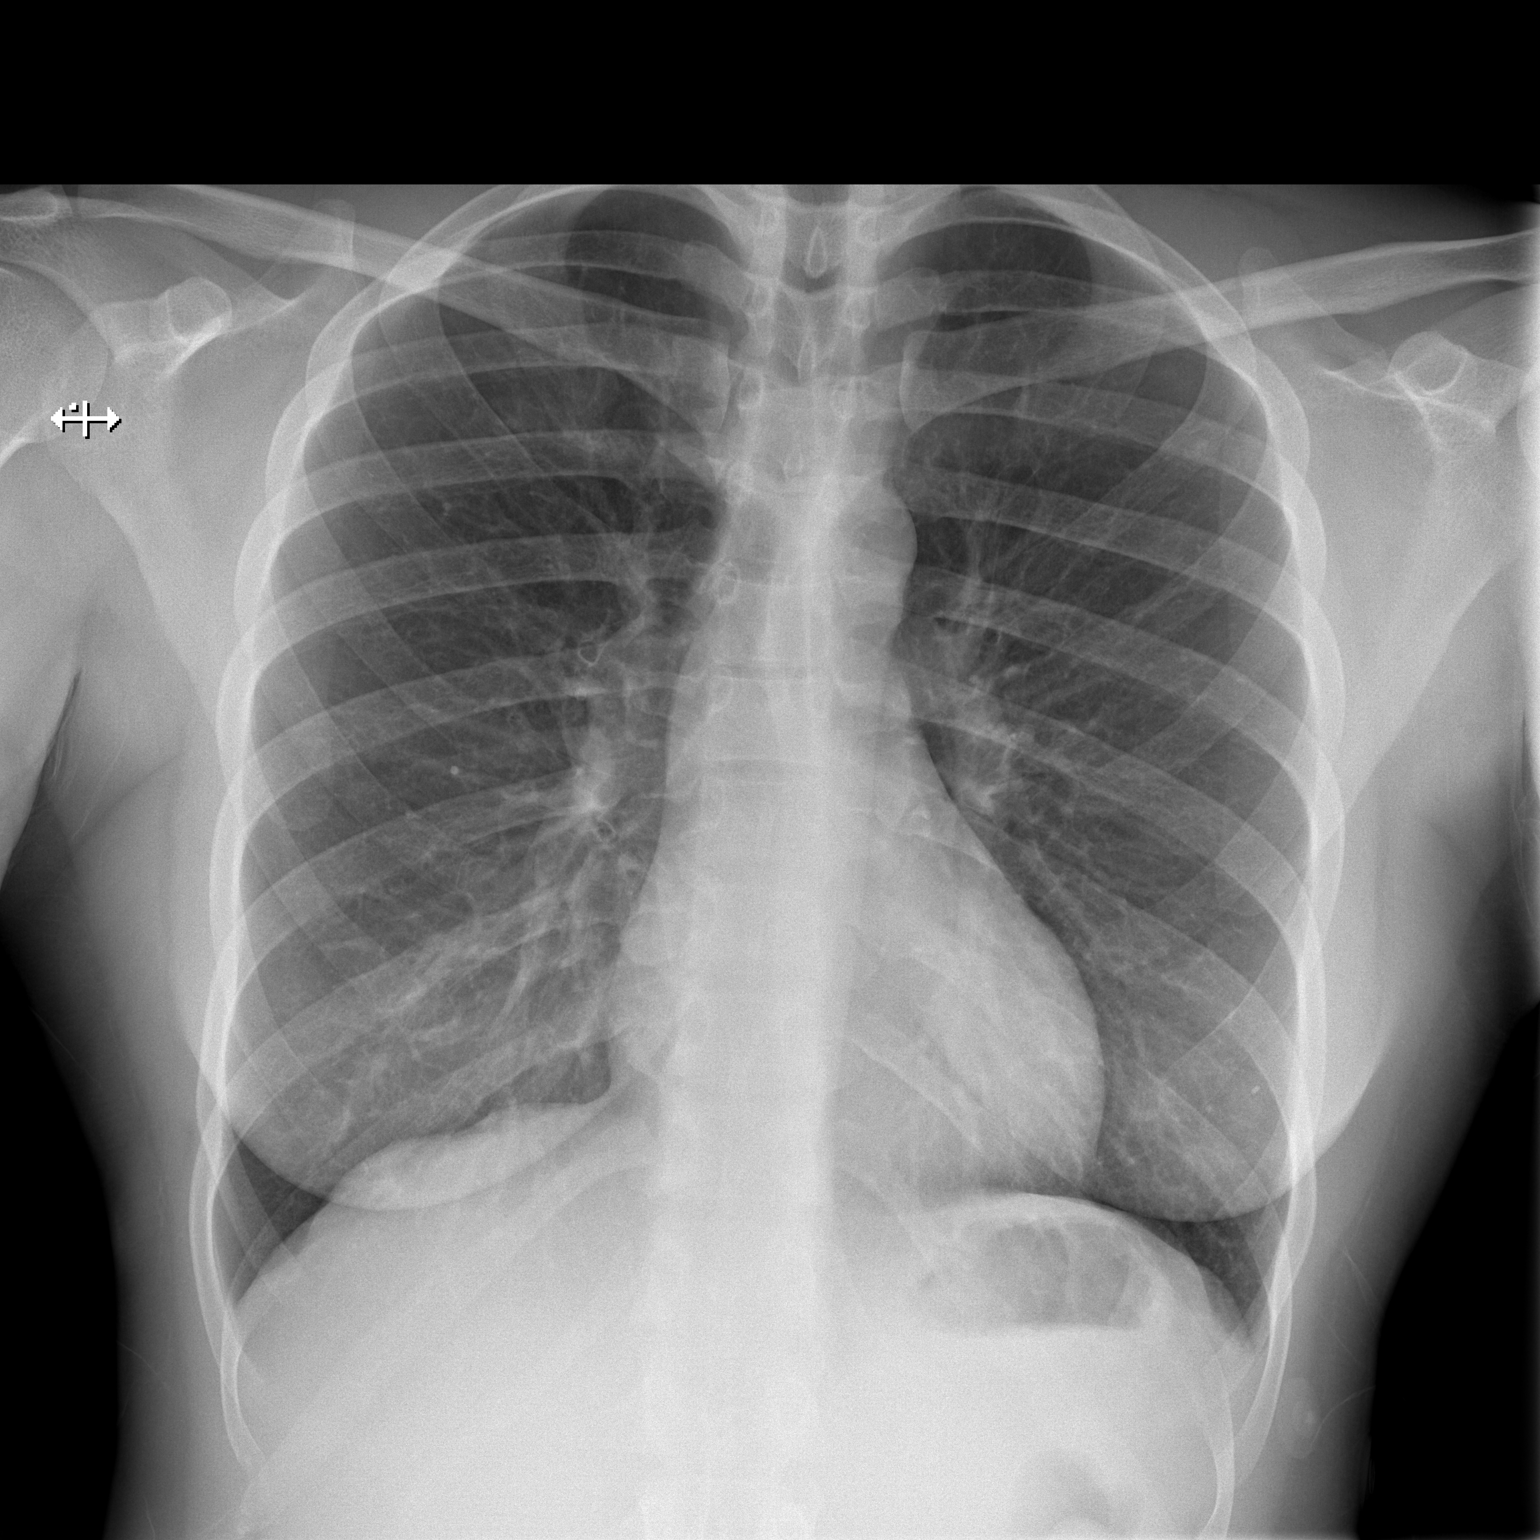

[w chest lat]
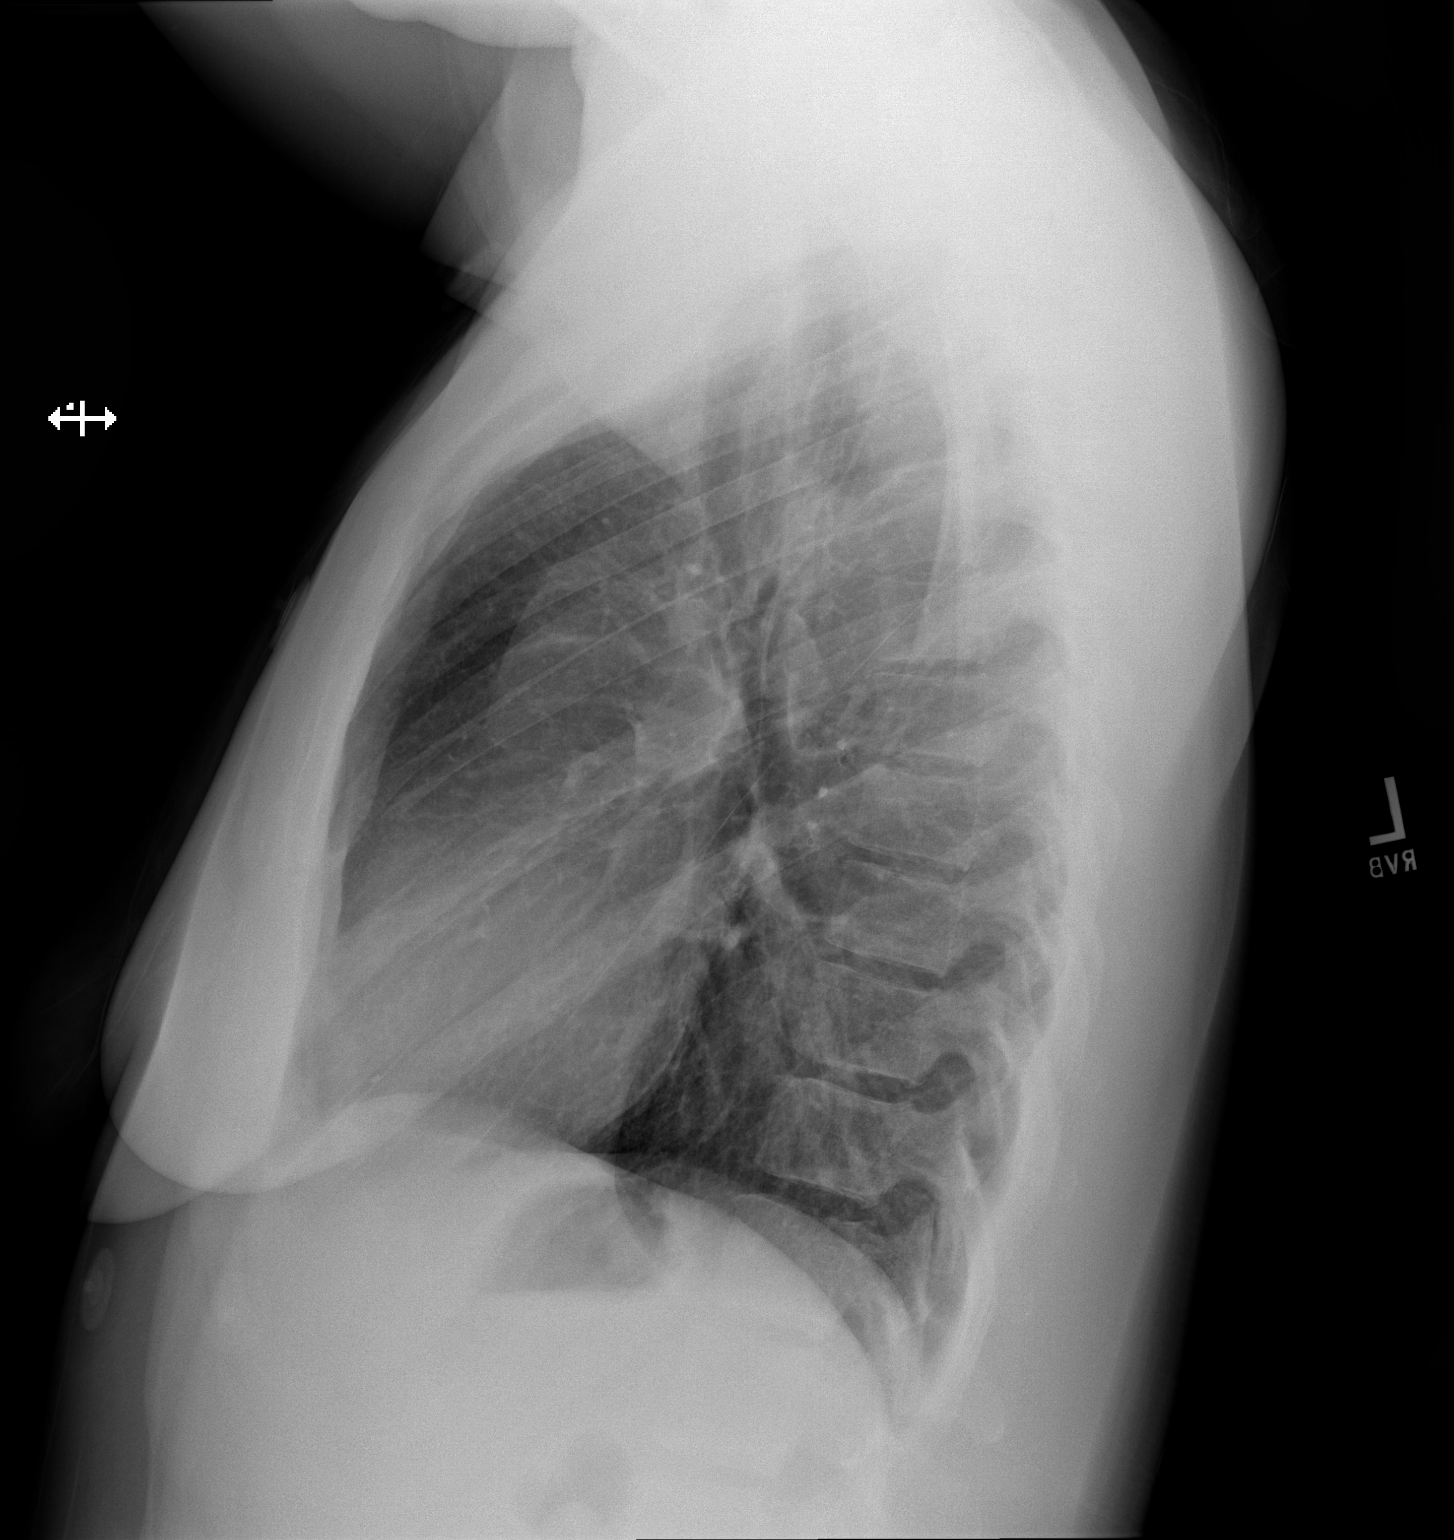

[2 of 2 positions shown; findings below may reference images not displayed]

FINDINGS: Normal cardiac and mediastinal contours. No consolidative pulmonary
opacities. No pleural effusion or pneumothorax. Regional skeleton is
unremarkable.
IMPRESSION: No acute cardiopulmonary process.
